# Patient Record
Sex: Female | Born: 1960 | Race: White | Hispanic: No | Marital: Married | Smoking: Former smoker
Health system: Southern US, Community
[De-identification: ages and names within clinical notes are randomized; demographics above are authoritative.]

## PROBLEM LIST (undated history)

## (undated) DIAGNOSIS — A6009 Herpesviral infection of other urogenital tract: Secondary | ICD-10-CM

## (undated) DIAGNOSIS — Z9010 Acquired absence of unspecified breast and nipple: Secondary | ICD-10-CM

## (undated) DIAGNOSIS — F32A Depression, unspecified: Secondary | ICD-10-CM

## (undated) DIAGNOSIS — I10 Essential (primary) hypertension: Secondary | ICD-10-CM

## (undated) DIAGNOSIS — F329 Major depressive disorder, single episode, unspecified: Secondary | ICD-10-CM

## (undated) DIAGNOSIS — E785 Hyperlipidemia, unspecified: Secondary | ICD-10-CM

## (undated) DIAGNOSIS — C50919 Malignant neoplasm of unspecified site of unspecified female breast: Secondary | ICD-10-CM

## (undated) HISTORY — DX: Essential (primary) hypertension: I10

## (undated) HISTORY — PX: OOPHORECTOMY: SHX86

## (undated) HISTORY — DX: Depression, unspecified: F32.A

## (undated) HISTORY — DX: Malignant neoplasm of unspecified site of unspecified female breast: C50.919

## (undated) HISTORY — DX: Acquired absence of unspecified breast and nipple: Z90.10

## (undated) HISTORY — PX: SALPINGOOPHORECTOMY: SHX82

## (undated) HISTORY — PX: BREAST BIOPSY: SHX20

## (undated) HISTORY — DX: Major depressive disorder, single episode, unspecified: F32.9

## (undated) HISTORY — PX: ABDOMINAL HYSTERECTOMY: SHX81

## (undated) HISTORY — DX: Herpesviral infection of other urogenital tract: A60.09

## (undated) HISTORY — DX: Hyperlipidemia, unspecified: E78.5

## (undated) HISTORY — PX: BREAST RECONSTRUCTION: SHX9

## (undated) HISTORY — PX: REDUCTION MAMMAPLASTY: SUR839

## (undated) HISTORY — PX: BREAST SURGERY: SHX581

## (undated) HISTORY — PX: CHOLECYSTECTOMY: SHX55

---

## 2002-10-21 ENCOUNTER — Other Ambulatory Visit: Admission: RE | Admit: 2002-10-21 | Discharge: 2002-10-21 | Payer: Self-pay | Admitting: *Deleted

## 2004-04-01 DIAGNOSIS — C50911 Malignant neoplasm of unspecified site of right female breast: Secondary | ICD-10-CM

## 2004-04-01 HISTORY — PX: MASTECTOMY: SHX3

## 2004-04-01 HISTORY — DX: Malignant neoplasm of unspecified site of right female breast: C50.911

## 2004-10-31 ENCOUNTER — Ambulatory Visit: Payer: Self-pay | Admitting: Endocrinology

## 2004-11-06 ENCOUNTER — Encounter (INDEPENDENT_AMBULATORY_CARE_PROVIDER_SITE_OTHER): Payer: Self-pay | Admitting: *Deleted

## 2004-11-06 ENCOUNTER — Encounter: Admission: RE | Admit: 2004-11-06 | Discharge: 2004-11-06 | Payer: Self-pay | Admitting: General Surgery

## 2004-11-06 ENCOUNTER — Encounter (INDEPENDENT_AMBULATORY_CARE_PROVIDER_SITE_OTHER): Payer: Self-pay | Admitting: Radiology

## 2004-11-13 ENCOUNTER — Encounter: Admission: RE | Admit: 2004-11-13 | Discharge: 2004-11-13 | Payer: Self-pay | Admitting: General Surgery

## 2004-11-19 ENCOUNTER — Encounter: Admission: RE | Admit: 2004-11-19 | Discharge: 2004-11-19 | Payer: Self-pay | Admitting: General Surgery

## 2004-11-21 ENCOUNTER — Ambulatory Visit (HOSPITAL_BASED_OUTPATIENT_CLINIC_OR_DEPARTMENT_OTHER): Admission: RE | Admit: 2004-11-21 | Discharge: 2004-11-22 | Payer: Self-pay | Admitting: General Surgery

## 2004-11-21 ENCOUNTER — Ambulatory Visit (HOSPITAL_COMMUNITY): Admission: RE | Admit: 2004-11-21 | Discharge: 2004-11-21 | Payer: Self-pay | Admitting: General Surgery

## 2004-11-21 ENCOUNTER — Encounter (INDEPENDENT_AMBULATORY_CARE_PROVIDER_SITE_OTHER): Payer: Self-pay | Admitting: *Deleted

## 2004-11-21 DIAGNOSIS — Z9010 Acquired absence of unspecified breast and nipple: Secondary | ICD-10-CM

## 2004-11-21 HISTORY — DX: Acquired absence of unspecified breast and nipple: Z90.10

## 2004-11-21 HISTORY — PX: MODIFIED RADICAL MASTECTOMY W/ AXILLARY LYMPH NODE DISSECTION: SHX2042

## 2004-11-29 ENCOUNTER — Ambulatory Visit: Payer: Self-pay | Admitting: Oncology

## 2004-12-12 ENCOUNTER — Ambulatory Visit (HOSPITAL_BASED_OUTPATIENT_CLINIC_OR_DEPARTMENT_OTHER): Admission: RE | Admit: 2004-12-12 | Discharge: 2004-12-12 | Payer: Self-pay

## 2004-12-12 ENCOUNTER — Ambulatory Visit (HOSPITAL_COMMUNITY): Admission: RE | Admit: 2004-12-12 | Discharge: 2004-12-12 | Payer: Self-pay

## 2004-12-13 ENCOUNTER — Ambulatory Visit: Payer: Self-pay | Admitting: Radiation Oncology

## 2005-01-14 ENCOUNTER — Ambulatory Visit: Payer: Self-pay | Admitting: Oncology

## 2005-03-04 ENCOUNTER — Ambulatory Visit: Payer: Self-pay | Admitting: Oncology

## 2005-04-19 ENCOUNTER — Ambulatory Visit: Payer: Self-pay | Admitting: Oncology

## 2005-04-22 ENCOUNTER — Ambulatory Visit: Payer: Self-pay | Admitting: Radiation Oncology

## 2005-05-02 ENCOUNTER — Ambulatory Visit: Payer: Self-pay | Admitting: Radiation Oncology

## 2005-05-30 ENCOUNTER — Ambulatory Visit: Payer: Self-pay | Admitting: Radiation Oncology

## 2005-06-14 ENCOUNTER — Ambulatory Visit: Payer: Self-pay | Admitting: Oncology

## 2005-06-30 ENCOUNTER — Ambulatory Visit: Payer: Self-pay | Admitting: Radiation Oncology

## 2005-08-23 ENCOUNTER — Ambulatory Visit: Payer: Self-pay | Admitting: Oncology

## 2005-08-27 LAB — CBC WITH DIFFERENTIAL (CANCER CENTER ONLY)
BASO#: 0.1 10*3/uL (ref 0.0–0.2)
BASO%: 1.6 % (ref 0.0–2.0)
EOS%: 4.5 % (ref 0.0–7.0)
Eosinophils Absolute: 0.2 10*3/uL (ref 0.0–0.5)
HCT: 38.6 % (ref 34.8–46.6)
HGB: 13 g/dL (ref 11.6–15.9)
LYMPH#: 1 10*3/uL (ref 0.9–3.3)
LYMPH%: 25 % (ref 14.0–48.0)
MCH: 32.4 pg (ref 26.0–34.0)
MCHC: 33.8 g/dL (ref 32.0–36.0)
MCV: 96 fL (ref 81–101)
MONO#: 0.3 10*3/uL (ref 0.1–0.9)
MONO%: 7.9 % (ref 0.0–13.0)
NEUT#: 2.5 10*3/uL (ref 1.5–6.5)
NEUT%: 61 % (ref 39.6–80.0)
Platelets: 122 10*3/uL — ABNORMAL LOW (ref 145–400)
RBC: 4.03 10*6/uL (ref 3.70–5.32)
RDW: 11.4 % (ref 10.5–14.6)
WBC: 4.1 10*3/uL (ref 3.9–10.0)

## 2005-08-27 LAB — LACTATE DEHYDROGENASE: LDH: 122 U/L (ref 94–250)

## 2005-08-27 LAB — COMPREHENSIVE METABOLIC PANEL
ALT: 19 U/L (ref 0–40)
AST: 23 U/L (ref 0–37)
Albumin: 3.7 g/dL (ref 3.5–5.2)
Alkaline Phosphatase: 64 U/L (ref 39–117)
BUN: 13 mg/dL (ref 6–23)
CO2: 26 mEq/L (ref 19–32)
Calcium: 9 mg/dL (ref 8.4–10.5)
Chloride: 105 mEq/L (ref 96–112)
Creatinine, Ser: 1.1 mg/dL (ref 0.4–1.2)
Glucose, Bld: 85 mg/dL (ref 70–99)
Potassium: 3.8 mEq/L (ref 3.5–5.3)
Sodium: 141 mEq/L (ref 135–145)
Total Bilirubin: 0.9 mg/dL (ref 0.3–1.2)
Total Protein: 6.3 g/dL (ref 6.0–8.3)

## 2005-08-27 LAB — CANCER ANTIGEN 27.29: CA 27.29: 13 U/mL (ref 0–39)

## 2005-09-09 ENCOUNTER — Ambulatory Visit (HOSPITAL_BASED_OUTPATIENT_CLINIC_OR_DEPARTMENT_OTHER): Admission: RE | Admit: 2005-09-09 | Discharge: 2005-09-09 | Payer: Self-pay | Admitting: General Surgery

## 2005-11-07 ENCOUNTER — Encounter: Admission: RE | Admit: 2005-11-07 | Discharge: 2005-11-07 | Payer: Self-pay | Admitting: Oncology

## 2005-11-11 ENCOUNTER — Ambulatory Visit: Payer: Self-pay | Admitting: Oncology

## 2005-11-12 LAB — CBC WITH DIFFERENTIAL (CANCER CENTER ONLY)
BASO#: 0 10*3/uL (ref 0.0–0.2)
BASO%: 0.3 % (ref 0.0–2.0)
EOS%: 3.9 % (ref 0.0–7.0)
Eosinophils Absolute: 0.1 10*3/uL (ref 0.0–0.5)
HCT: 40 % (ref 34.8–46.6)
HGB: 13.4 g/dL (ref 11.6–15.9)
LYMPH#: 0.9 10*3/uL (ref 0.9–3.3)
LYMPH%: 25.9 % (ref 14.0–48.0)
MCH: 31.5 pg (ref 26.0–34.0)
MCHC: 33.4 g/dL (ref 32.0–36.0)
MCV: 94 fL (ref 81–101)
MONO#: 0.2 10*3/uL (ref 0.1–0.9)
MONO%: 5.5 % (ref 0.0–13.0)
NEUT#: 2.2 10*3/uL (ref 1.5–6.5)
NEUT%: 64.4 % (ref 39.6–80.0)
Platelets: 139 10*3/uL — ABNORMAL LOW (ref 145–400)
RBC: 4.25 10*6/uL (ref 3.70–5.32)
RDW: 10.2 % — ABNORMAL LOW (ref 10.5–14.6)
WBC: 3.4 10*3/uL — ABNORMAL LOW (ref 3.9–10.0)

## 2005-11-12 LAB — CANCER ANTIGEN 27.29: CA 27.29: 16 U/mL (ref 0–39)

## 2005-11-12 LAB — COMPREHENSIVE METABOLIC PANEL
ALT: 15 U/L (ref 0–40)
AST: 19 U/L (ref 0–37)
Albumin: 4.3 g/dL (ref 3.5–5.2)
Alkaline Phosphatase: 68 U/L (ref 39–117)
BUN: 17 mg/dL (ref 6–23)
CO2: 26 mEq/L (ref 19–32)
Calcium: 9 mg/dL (ref 8.4–10.5)
Chloride: 105 mEq/L (ref 96–112)
Creatinine, Ser: 0.98 mg/dL (ref 0.40–1.20)
Glucose, Bld: 85 mg/dL (ref 70–99)
Potassium: 4.1 mEq/L (ref 3.5–5.3)
Sodium: 142 mEq/L (ref 135–145)
Total Bilirubin: 0.6 mg/dL (ref 0.3–1.2)
Total Protein: 6.9 g/dL (ref 6.0–8.3)

## 2005-11-12 LAB — LACTATE DEHYDROGENASE: LDH: 145 U/L (ref 94–250)

## 2005-11-14 ENCOUNTER — Ambulatory Visit (HOSPITAL_COMMUNITY): Admission: RE | Admit: 2005-11-14 | Discharge: 2005-11-14 | Payer: Self-pay | Admitting: Oncology

## 2005-12-19 ENCOUNTER — Ambulatory Visit: Payer: Self-pay | Admitting: Radiation Oncology

## 2006-02-13 ENCOUNTER — Encounter: Admission: RE | Admit: 2006-02-13 | Discharge: 2006-02-13 | Payer: Self-pay | Admitting: General Surgery

## 2006-05-20 ENCOUNTER — Ambulatory Visit: Payer: Self-pay | Admitting: Oncology

## 2006-05-21 LAB — CBC WITH DIFFERENTIAL (CANCER CENTER ONLY)
BASO#: 0 10*3/uL (ref 0.0–0.2)
BASO%: 0.5 % (ref 0.0–2.0)
EOS%: 2 % (ref 0.0–7.0)
Eosinophils Absolute: 0.1 10*3/uL (ref 0.0–0.5)
HCT: 41 % (ref 34.8–46.6)
HGB: 14 g/dL (ref 11.6–15.9)
LYMPH#: 1.2 10*3/uL (ref 0.9–3.3)
LYMPH%: 27.2 % (ref 14.0–48.0)
MCH: 31.7 pg (ref 26.0–34.0)
MCHC: 34.1 g/dL (ref 32.0–36.0)
MCV: 93 fL (ref 81–101)
MONO#: 0.3 10*3/uL (ref 0.1–0.9)
MONO%: 6.7 % (ref 0.0–13.0)
NEUT#: 2.8 10*3/uL (ref 1.5–6.5)
NEUT%: 63.6 % (ref 39.6–80.0)
Platelets: 158 10*3/uL (ref 145–400)
RBC: 4.42 10*6/uL (ref 3.70–5.32)
RDW: 11.1 % (ref 10.5–14.6)
WBC: 4.4 10*3/uL (ref 3.9–10.0)

## 2006-05-21 LAB — COMPREHENSIVE METABOLIC PANEL
ALT: 18 U/L (ref 0–35)
AST: 19 U/L (ref 0–37)
Albumin: 4.2 g/dL (ref 3.5–5.2)
Alkaline Phosphatase: 69 U/L (ref 39–117)
BUN: 11 mg/dL (ref 6–23)
CO2: 26 mEq/L (ref 19–32)
Calcium: 9.3 mg/dL (ref 8.4–10.5)
Chloride: 104 mEq/L (ref 96–112)
Creatinine, Ser: 0.99 mg/dL (ref 0.40–1.20)
Glucose, Bld: 84 mg/dL (ref 70–99)
Potassium: 3.8 mEq/L (ref 3.5–5.3)
Sodium: 142 mEq/L (ref 135–145)
Total Bilirubin: 0.6 mg/dL (ref 0.3–1.2)
Total Protein: 7.2 g/dL (ref 6.0–8.3)

## 2006-05-21 LAB — FSH/LH
FSH: 18.5 m[IU]/mL
LH: 19.7 m[IU]/mL

## 2006-05-21 LAB — CANCER ANTIGEN 27.29: CA 27.29: 15 U/mL (ref 0–39)

## 2006-06-02 LAB — ESTRADIOL, ULTRA SENS: Estradiol, Ultra Sensitive: <2 pg/mL

## 2006-06-18 ENCOUNTER — Ambulatory Visit: Payer: Self-pay | Admitting: Radiation Oncology

## 2006-11-18 ENCOUNTER — Ambulatory Visit: Payer: Self-pay | Admitting: Oncology

## 2006-11-19 LAB — COMPREHENSIVE METABOLIC PANEL
ALT: 15 U/L (ref 0–35)
AST: 20 U/L (ref 0–37)
Albumin: 4.2 g/dL (ref 3.5–5.2)
Alkaline Phosphatase: 62 U/L (ref 39–117)
BUN: 13 mg/dL (ref 6–23)
CO2: 26 mEq/L (ref 19–32)
Calcium: 9.1 mg/dL (ref 8.4–10.5)
Chloride: 102 mEq/L (ref 96–112)
Creatinine, Ser: 0.88 mg/dL (ref 0.40–1.20)
Glucose, Bld: 86 mg/dL (ref 70–99)
Potassium: 4.1 mEq/L (ref 3.5–5.3)
Sodium: 137 mEq/L (ref 135–145)
Total Bilirubin: 0.7 mg/dL (ref 0.3–1.2)
Total Protein: 6.9 g/dL (ref 6.0–8.3)

## 2006-11-19 LAB — CBC WITH DIFFERENTIAL (CANCER CENTER ONLY)
BASO#: 0 10*3/uL (ref 0.0–0.2)
BASO%: 0.5 % (ref 0.0–2.0)
EOS%: 1.8 % (ref 0.0–7.0)
Eosinophils Absolute: 0.1 10*3/uL (ref 0.0–0.5)
HCT: 39.6 % (ref 34.8–46.6)
HGB: 13.5 g/dL (ref 11.6–15.9)
LYMPH#: 1.4 10*3/uL (ref 0.9–3.3)
LYMPH%: 29 % (ref 14.0–48.0)
MCH: 31 pg (ref 26.0–34.0)
MCHC: 34.2 g/dL (ref 32.0–36.0)
MCV: 91 fL (ref 81–101)
MONO#: 0.2 10*3/uL (ref 0.1–0.9)
MONO%: 4.3 % (ref 0.0–13.0)
NEUT#: 3.2 10*3/uL (ref 1.5–6.5)
NEUT%: 64.4 % (ref 39.6–80.0)
Platelets: 146 10*3/uL (ref 145–400)
RBC: 4.36 10*6/uL (ref 3.70–5.32)
RDW: 11.5 % (ref 10.5–14.6)
WBC: 4.9 10*3/uL (ref 3.9–10.0)

## 2006-11-19 LAB — LACTATE DEHYDROGENASE: LDH: 150 U/L (ref 94–250)

## 2006-11-19 LAB — CANCER ANTIGEN 27.29: CA 27.29: 13 U/mL (ref 0–39)

## 2006-11-20 ENCOUNTER — Encounter: Admission: RE | Admit: 2006-11-20 | Discharge: 2006-11-20 | Payer: Self-pay | Admitting: Oncology

## 2006-11-25 ENCOUNTER — Ambulatory Visit (HOSPITAL_COMMUNITY): Admission: RE | Admit: 2006-11-25 | Discharge: 2006-11-25 | Payer: Self-pay | Admitting: Oncology

## 2006-12-04 ENCOUNTER — Encounter: Admission: RE | Admit: 2006-12-04 | Discharge: 2006-12-04 | Payer: Self-pay | Admitting: Oncology

## 2007-05-18 ENCOUNTER — Ambulatory Visit: Payer: Self-pay | Admitting: Oncology

## 2007-05-20 LAB — CBC WITH DIFFERENTIAL (CANCER CENTER ONLY)
BASO#: 0 10*3/uL (ref 0.0–0.2)
BASO%: 0.4 % (ref 0.0–2.0)
EOS%: 2.6 % (ref 0.0–7.0)
Eosinophils Absolute: 0.1 10*3/uL (ref 0.0–0.5)
HCT: 38.1 % (ref 34.8–46.6)
HGB: 13.1 g/dL (ref 11.6–15.9)
LYMPH#: 1.6 10*3/uL (ref 0.9–3.3)
LYMPH%: 34 % (ref 14.0–48.0)
MCH: 33 pg (ref 26.0–34.0)
MCHC: 34.4 g/dL (ref 32.0–36.0)
MCV: 96 fL (ref 81–101)
MONO#: 0.3 10*3/uL (ref 0.1–0.9)
MONO%: 5.9 % (ref 0.0–13.0)
NEUT#: 2.7 10*3/uL (ref 1.5–6.5)
NEUT%: 57.1 % (ref 39.6–80.0)
Platelets: 172 10*3/uL (ref 145–400)
RBC: 3.98 10*6/uL (ref 3.70–5.32)
RDW: 10.9 % (ref 10.5–14.6)
WBC: 4.7 10*3/uL (ref 3.9–10.0)

## 2007-05-21 LAB — COMPREHENSIVE METABOLIC PANEL
ALT: 19 U/L (ref 0–35)
AST: 19 U/L (ref 0–37)
Albumin: 4.3 g/dL (ref 3.5–5.2)
Alkaline Phosphatase: 62 U/L (ref 39–117)
BUN: 13 mg/dL (ref 6–23)
CO2: 27 mEq/L (ref 19–32)
Calcium: 9.4 mg/dL (ref 8.4–10.5)
Chloride: 101 mEq/L (ref 96–112)
Creatinine, Ser: 1.03 mg/dL (ref 0.40–1.20)
Glucose, Bld: 94 mg/dL (ref 70–99)
Potassium: 3.6 mEq/L (ref 3.5–5.3)
Sodium: 139 mEq/L (ref 135–145)
Total Bilirubin: 0.5 mg/dL (ref 0.3–1.2)
Total Protein: 7.1 g/dL (ref 6.0–8.3)

## 2007-05-21 LAB — CANCER ANTIGEN 27.29: CA 27.29: 8 U/mL (ref 0–39)

## 2007-11-23 ENCOUNTER — Encounter: Admission: RE | Admit: 2007-11-23 | Discharge: 2007-11-23 | Payer: Self-pay | Admitting: Oncology

## 2007-11-24 ENCOUNTER — Ambulatory Visit: Payer: Self-pay | Admitting: Obstetrics & Gynecology

## 2007-11-25 ENCOUNTER — Ambulatory Visit: Payer: Self-pay | Admitting: Oncology

## 2007-11-26 LAB — CBC WITH DIFFERENTIAL (CANCER CENTER ONLY)
BASO#: 0 10e3/uL (ref 0.0–0.2)
BASO%: 0.4 % (ref 0.0–2.0)
EOS%: 2.8 % (ref 0.0–7.0)
Eosinophils Absolute: 0.1 10e3/uL (ref 0.0–0.5)
HCT: 36 % (ref 34.8–46.6)
HGB: 12.7 g/dL (ref 11.6–15.9)
LYMPH#: 1.8 10e3/uL (ref 0.9–3.3)
LYMPH%: 40.5 % (ref 14.0–48.0)
MCH: 33.3 pg (ref 26.0–34.0)
MCHC: 35.3 g/dL (ref 32.0–36.0)
MCV: 94 fL (ref 81–101)
MONO#: 0.3 10e3/uL (ref 0.1–0.9)
MONO%: 6.6 % (ref 0.0–13.0)
NEUT#: 2.2 10e3/uL (ref 1.5–6.5)
NEUT%: 49.7 % (ref 39.6–80.0)
Platelets: 156 10e3/uL (ref 145–400)
RBC: 3.82 10e6/uL (ref 3.70–5.32)
RDW: 11.4 % (ref 10.5–14.6)
WBC: 4.3 10e3/uL (ref 3.9–10.0)

## 2007-11-26 LAB — COMPREHENSIVE METABOLIC PANEL WITH GFR
ALT: 18 U/L (ref 0–35)
AST: 23 U/L (ref 0–37)
Albumin: 4.2 g/dL (ref 3.5–5.2)
Alkaline Phosphatase: 48 U/L (ref 39–117)
BUN: 22 mg/dL (ref 6–23)
CO2: 23 meq/L (ref 19–32)
Calcium: 9.2 mg/dL (ref 8.4–10.5)
Chloride: 102 meq/L (ref 96–112)
Creatinine, Ser: 1.18 mg/dL (ref 0.40–1.20)
Glucose, Bld: 86 mg/dL (ref 70–99)
Potassium: 3.8 meq/L (ref 3.5–5.3)
Sodium: 137 meq/L (ref 135–145)
Total Bilirubin: 0.8 mg/dL (ref 0.3–1.2)
Total Protein: 7 g/dL (ref 6.0–8.3)

## 2007-11-26 LAB — CANCER ANTIGEN 27.29: CA 27.29: 13 U/mL (ref 0–39)

## 2007-12-09 ENCOUNTER — Ambulatory Visit: Payer: Self-pay | Admitting: Obstetrics and Gynecology

## 2008-02-19 IMAGING — PT NM PET TUM IMG SKULL BASE T - THIGH
6 series · 25 of 25 positions shown · non-contrast
Comparison: PET-CT, 11/14/2005

CLINICAL DATA: 46-year-old female history of right-sided breast cancer. 
Status-post chemotherapy last treatment April 2005 with Tamoxifen ongoing. 
Radiation therapy last May 2005.  Single right axillary sentinel lymph node
positive for metastatic carcinoma.
Restaging:
FDG PET-CT TUMOR IMAGING (SKULL BASE TO THIGHS):

Fasting Blood Glucose:  88
TECHNIQUE: 17.2 mCi F-18 FDG was injected intravenously via the left wrist . 
Full-ring PET imaging was performed from the skull base through the mid-thighs
65 minutes after injection.  CT data was obtained and used for attenuation
correction and anatomic localization only.  (This was not acquired as a
diagnostic CT examination.)

[Series 1: pet ac · axial · 3.3mm · 4.69mm/px · z∈[-870,+0]mm · 5 of 267 slices shown]
[im 1/267]
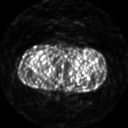
[im 67/267]
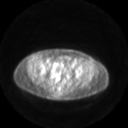
[im 134/267]
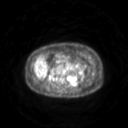
[im 200/267]
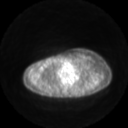
[im 267/267]
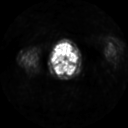

[Series 2: pet nac · axial · 3.3mm · 4.69mm/px · z∈[-870,+0]mm · 6 of 267 slices shown]
[im 1/267]
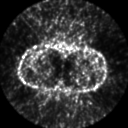
[im 54/267]
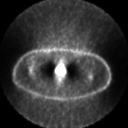
[im 107/267]
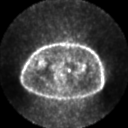
[im 160/267]
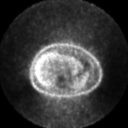
[im 213/267]
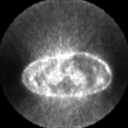
[im 267/267]
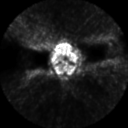

[Series 2: ct images · axial · 3.8mm · 0.98mm/px · z∈[-870,+0]mm · 6 of 267 slices shown]
[im 1/267]
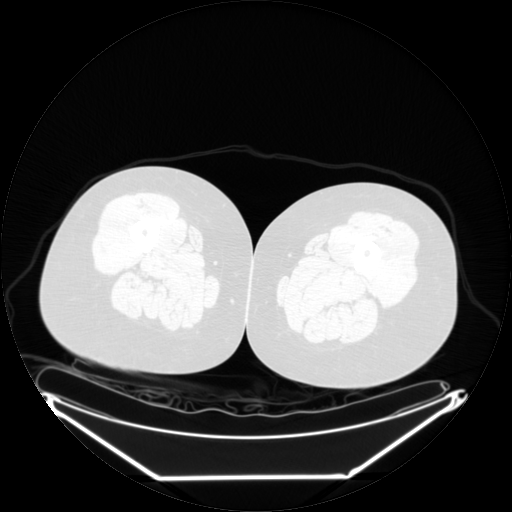
[im 54/267]
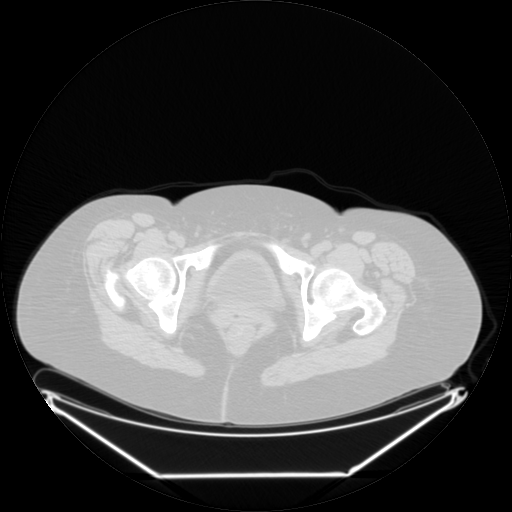
[im 107/267]
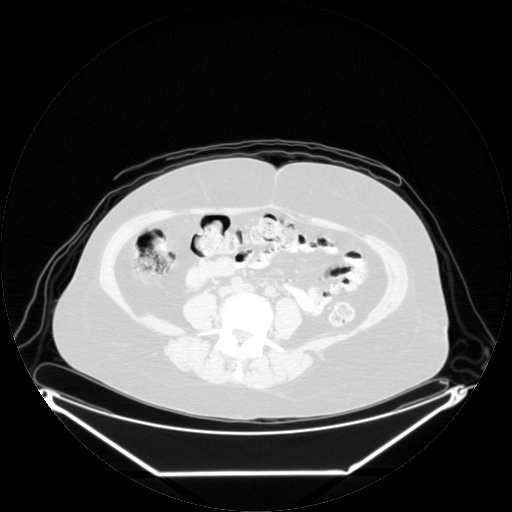
[im 160/267]
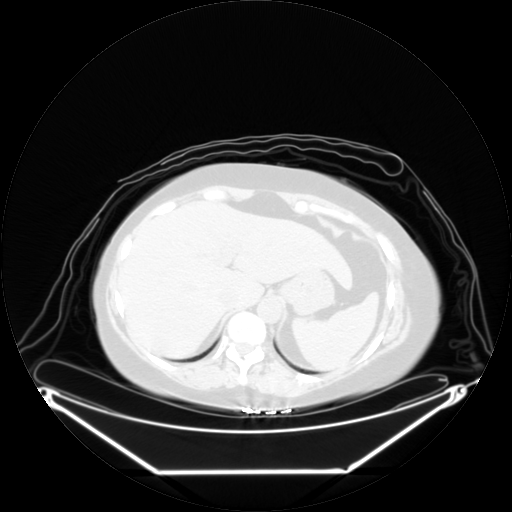
[im 213/267]
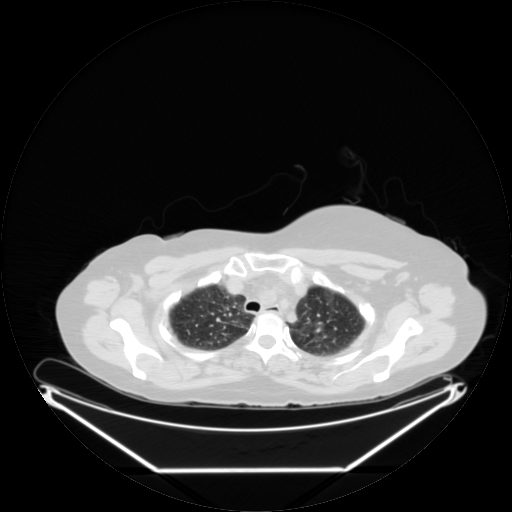
[im 267/267  brain]
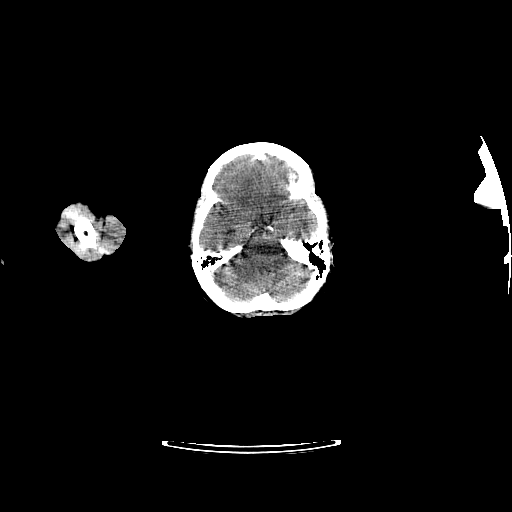

[Series 123: mip · coronal · 3.3mm · 4.69mm/px · 1 of 30 slices shown]
[im 1/30]
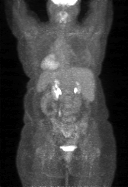

[Series 151: reformatted · axial · 3.3mm · 3.91mm/px · z∈[-867,-10]mm · 6 of 261 slices shown (1 of 2)]
[im 1/261]
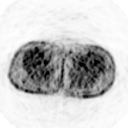
[im 53/261]
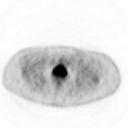
[im 105/261]
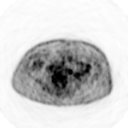
[im 157/261]
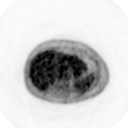
[im 209/261]
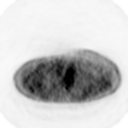
[im 261/261]
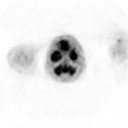

[Series 153: reformatted · coronal · 4.7mm · 6.98mm/px · 1 of 57 slices shown (2 of 2)]
[im 1/57]
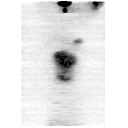

[25 of 25 positions shown; findings below may reference images not displayed]

FINDINGS: Neck: No evidence of hypermetabolic or lymphadenopathy.

Chest: This prior right mastectomy and axillary nodal dissection. Mild along the
postsurgical site extending to the axillary nodal dissection is slightly more
hypermetabolic than prior measuring SUV max 2.6 compared to 1.7 on prior. There
is also mild thickening of the tissue medially adjacent to the axilla surgical
clips which now measures 7 mm compared to 4 mm on  prior. No evidence of
hypermetabolic right  axillary nodes. There is a hypermetabolic left axillary
lymph node which is has benign morphology measuring 7 mm with SUV max = 2.3. No
evidence of mediastinal adenopathy or hypermetabolic nodes. No evidence
hypermetabolic or suspicious pulmonary nodules.

Abdomen: No abnormal FDG activity in the liver, spleen, pancreas, adrenal
glands, kidneys. Stable hypodensity within the right lateral of the liver.

Pelvis: No evidence of hypermetabolic nodes or lymphadenopathy in the pelvis.13
mm left inguinal node is  unchanged from prior and does not have significant
metabolic activity.
IMPRESSION: 1. Mild thickening in the region of the right axillary nodal dissection along
with diffuse mild  increased metabolic activity within the mastectomy surgical
bed are nonspecific findings and likely related to postsurgical change and/or
radiation change. Typically however active does not increase over time.
Recommend attention on followup.

2. Low-level uptake of a left axillary lymph node with unchanged CT imaging
characteristics  may be related to same side FDG venous injection. Nonspecific
finding.  Recommend attention on follow up.

3. No evidence of metastatic disease in the abdomen or pelvis.

## 2008-04-12 ENCOUNTER — Ambulatory Visit (HOSPITAL_COMMUNITY): Admission: RE | Admit: 2008-04-12 | Discharge: 2008-04-12 | Payer: Self-pay | Admitting: Obstetrics and Gynecology

## 2008-04-12 ENCOUNTER — Encounter (INDEPENDENT_AMBULATORY_CARE_PROVIDER_SITE_OTHER): Payer: Self-pay | Admitting: Obstetrics and Gynecology

## 2008-04-26 ENCOUNTER — Ambulatory Visit: Payer: Self-pay | Admitting: Oncology

## 2008-06-09 ENCOUNTER — Ambulatory Visit: Payer: Self-pay | Admitting: Oncology

## 2008-06-15 LAB — CMP (CANCER CENTER ONLY)
ALT(SGPT): 25 U/L (ref 10–47)
AST: 32 U/L (ref 11–38)
Albumin: 3.8 g/dL (ref 3.3–5.5)
Alkaline Phosphatase: 64 U/L (ref 26–84)
BUN, Bld: 14 mg/dL (ref 7–22)
CO2: 28 mEq/L (ref 18–33)
Calcium: 9.4 mg/dL (ref 8.0–10.3)
Chloride: 98 mEq/L (ref 98–108)
Creat: 1 mg/dl (ref 0.6–1.2)
Glucose, Bld: 91 mg/dL (ref 73–118)
Potassium: 3.7 mEq/L (ref 3.3–4.7)
Sodium: 140 mEq/L (ref 128–145)
Total Bilirubin: 1 mg/dl (ref 0.20–1.60)
Total Protein: 7.6 g/dL (ref 6.4–8.1)

## 2008-06-15 LAB — CBC WITH DIFFERENTIAL (CANCER CENTER ONLY)
BASO#: 0 10*3/uL (ref 0.0–0.2)
BASO%: 0.7 % (ref 0.0–2.0)
EOS%: 3.4 % (ref 0.0–7.0)
Eosinophils Absolute: 0.2 10*3/uL (ref 0.0–0.5)
HCT: 41.3 % (ref 34.8–46.6)
HGB: 14 g/dL (ref 11.6–15.9)
LYMPH#: 1.4 10*3/uL (ref 0.9–3.3)
LYMPH%: 33.8 % (ref 14.0–48.0)
MCH: 31.2 pg (ref 26.0–34.0)
MCHC: 33.9 g/dL (ref 32.0–36.0)
MCV: 92 fL (ref 81–101)
MONO#: 0.2 10*3/uL (ref 0.1–0.9)
MONO%: 5.6 % (ref 0.0–13.0)
NEUT#: 2.4 10*3/uL (ref 1.5–6.5)
NEUT%: 56.5 % (ref 39.6–80.0)
Platelets: 172 10*3/uL (ref 145–400)
RBC: 4.48 10*6/uL (ref 3.70–5.32)
RDW: 10.9 % (ref 10.5–14.6)
WBC: 4.3 10*3/uL (ref 3.9–10.0)

## 2008-06-15 LAB — LACTATE DEHYDROGENASE: LDH: 172 U/L (ref 94–250)

## 2008-09-13 ENCOUNTER — Ambulatory Visit: Payer: Self-pay | Admitting: Oncology

## 2008-09-15 LAB — CMP (CANCER CENTER ONLY)
ALT(SGPT): 30 U/L (ref 10–47)
AST: 34 U/L (ref 11–38)
Albumin: 3.8 g/dL (ref 3.3–5.5)
Alkaline Phosphatase: 79 U/L (ref 26–84)
BUN, Bld: 14 mg/dL (ref 7–22)
CO2: 30 mEq/L (ref 18–33)
Calcium: 9.6 mg/dL (ref 8.0–10.3)
Chloride: 103 mEq/L (ref 98–108)
Creat: 1.1 mg/dl (ref 0.6–1.2)
Glucose, Bld: 88 mg/dL (ref 73–118)
Potassium: 3.8 mEq/L (ref 3.3–4.7)
Sodium: 143 mEq/L (ref 128–145)
Total Bilirubin: 1 mg/dl (ref 0.20–1.60)
Total Protein: 7.6 g/dL (ref 6.4–8.1)

## 2008-09-15 LAB — CBC WITH DIFFERENTIAL (CANCER CENTER ONLY)
BASO#: 0 10*3/uL (ref 0.0–0.2)
BASO%: 0.6 % (ref 0.0–2.0)
EOS%: 3.3 % (ref 0.0–7.0)
Eosinophils Absolute: 0.2 10*3/uL (ref 0.0–0.5)
HCT: 37.9 % (ref 34.8–46.6)
HGB: 13.4 g/dL (ref 11.6–15.9)
LYMPH#: 1.6 10*3/uL (ref 0.9–3.3)
LYMPH%: 32.4 % (ref 14.0–48.0)
MCH: 31.6 pg (ref 26.0–34.0)
MCHC: 35.4 g/dL (ref 32.0–36.0)
MCV: 89 fL (ref 81–101)
MONO#: 0.3 10*3/uL (ref 0.1–0.9)
MONO%: 6.1 % (ref 0.0–13.0)
NEUT#: 2.8 10*3/uL (ref 1.5–6.5)
NEUT%: 57.6 % (ref 39.6–80.0)
Platelets: 192 10*3/uL (ref 145–400)
RBC: 4.26 10*6/uL (ref 3.70–5.32)
RDW: 12 % (ref 10.5–14.6)
WBC: 4.9 10*3/uL (ref 3.9–10.0)

## 2008-09-15 LAB — CANCER ANTIGEN 27.29: CA 27.29: 8 U/mL (ref 0–39)

## 2008-11-23 ENCOUNTER — Encounter: Admission: RE | Admit: 2008-11-23 | Discharge: 2008-11-23 | Payer: Self-pay | Admitting: Oncology

## 2009-03-01 ENCOUNTER — Inpatient Hospital Stay (HOSPITAL_COMMUNITY): Admission: RE | Admit: 2009-03-01 | Discharge: 2009-03-04 | Payer: Self-pay | Admitting: Plastic Surgery

## 2009-03-01 ENCOUNTER — Encounter (INDEPENDENT_AMBULATORY_CARE_PROVIDER_SITE_OTHER): Payer: Self-pay | Admitting: Plastic Surgery

## 2009-04-10 ENCOUNTER — Ambulatory Visit: Payer: Self-pay | Admitting: Oncology

## 2009-05-03 LAB — CBC WITH DIFFERENTIAL (CANCER CENTER ONLY)
BASO#: 0 10*3/uL (ref 0.0–0.2)
BASO%: 0.5 % (ref 0.0–2.0)
EOS%: 2.7 % (ref 0.0–7.0)
Eosinophils Absolute: 0.1 10*3/uL (ref 0.0–0.5)
HCT: 38.6 % (ref 34.8–46.6)
HGB: 13.1 g/dL (ref 11.6–15.9)
LYMPH#: 1.4 10*3/uL (ref 0.9–3.3)
LYMPH%: 30.9 % (ref 14.0–48.0)
MCH: 30.8 pg (ref 26.0–34.0)
MCHC: 34 g/dL (ref 32.0–36.0)
MCV: 91 fL (ref 81–101)
MONO#: 0.3 10*3/uL (ref 0.1–0.9)
MONO%: 6.4 % (ref 0.0–13.0)
NEUT#: 2.7 10*3/uL (ref 1.5–6.5)
NEUT%: 59.5 % (ref 39.6–80.0)
Platelets: 169 10*3/uL (ref 145–400)
RBC: 4.26 10*6/uL (ref 3.70–5.32)
RDW: 12.2 % (ref 10.5–14.6)
WBC: 4.5 10*3/uL (ref 3.9–10.0)

## 2009-05-03 LAB — CMP (CANCER CENTER ONLY)
ALT(SGPT): 22 U/L (ref 10–47)
AST: 27 U/L (ref 11–38)
Albumin: 3.8 g/dL (ref 3.3–5.5)
Alkaline Phosphatase: 71 U/L (ref 26–84)
BUN, Bld: 13 mg/dL (ref 7–22)
CO2: 31 mEq/L (ref 18–33)
Calcium: 9.3 mg/dL (ref 8.0–10.3)
Chloride: 98 mEq/L (ref 98–108)
Creat: 0.8 mg/dl (ref 0.6–1.2)
Glucose, Bld: 86 mg/dL (ref 73–118)
Potassium: 4 mEq/L (ref 3.3–4.7)
Sodium: 140 mEq/L (ref 128–145)
Total Bilirubin: 1 mg/dl (ref 0.20–1.60)
Total Protein: 7.3 g/dL (ref 6.4–8.1)

## 2009-05-03 LAB — CANCER ANTIGEN 27.29: CA 27.29: 13 U/mL (ref 0–39)

## 2009-11-10 ENCOUNTER — Ambulatory Visit (HOSPITAL_BASED_OUTPATIENT_CLINIC_OR_DEPARTMENT_OTHER): Payer: PRIVATE HEALTH INSURANCE | Admitting: Oncology

## 2009-11-16 LAB — CBC WITH DIFFERENTIAL (CANCER CENTER ONLY)
BASO#: 0 10*3/uL (ref 0.0–0.2)
BASO%: 0.3 % (ref 0.0–2.0)
EOS%: 3.4 % (ref 0.0–7.0)
Eosinophils Absolute: 0.1 10*3/uL (ref 0.0–0.5)
HCT: 38.8 % (ref 34.8–46.6)
HGB: 13.6 g/dL (ref 11.6–15.9)
LYMPH#: 1.2 10*3/uL (ref 0.9–3.3)
LYMPH%: 33.1 % (ref 14.0–48.0)
MCH: 31.1 pg (ref 26.0–34.0)
MCHC: 35.1 g/dL (ref 32.0–36.0)
MCV: 89 fL (ref 81–101)
MONO#: 0.3 10*3/uL (ref 0.1–0.9)
MONO%: 8.1 % (ref 0.0–13.0)
NEUT#: 2 10*3/uL (ref 1.5–6.5)
NEUT%: 55.1 % (ref 39.6–80.0)
Platelets: 156 10*3/uL (ref 145–400)
RBC: 4.38 10*6/uL (ref 3.70–5.32)
RDW: 12.1 % (ref 10.5–14.6)
WBC: 3.6 10*3/uL — ABNORMAL LOW (ref 3.9–10.0)

## 2009-11-16 LAB — CMP (CANCER CENTER ONLY)
ALT(SGPT): 20 U/L (ref 10–47)
AST: 25 U/L (ref 11–38)
Albumin: 3.7 g/dL (ref 3.3–5.5)
Alkaline Phosphatase: 69 U/L (ref 26–84)
BUN, Bld: 14 mg/dL (ref 7–22)
CO2: 29 mEq/L (ref 18–33)
Calcium: 9.1 mg/dL (ref 8.0–10.3)
Chloride: 102 mEq/L (ref 98–108)
Creat: 0.9 mg/dl (ref 0.6–1.2)
Glucose, Bld: 101 mg/dL (ref 73–118)
Potassium: 3.9 mEq/L (ref 3.3–4.7)
Sodium: 144 mEq/L (ref 128–145)
Total Bilirubin: 1 mg/dl (ref 0.20–1.60)
Total Protein: 7.1 g/dL (ref 6.4–8.1)

## 2009-11-27 ENCOUNTER — Encounter: Admission: RE | Admit: 2009-11-27 | Discharge: 2009-11-27 | Payer: Self-pay | Admitting: Oncology

## 2010-04-22 ENCOUNTER — Encounter: Payer: Self-pay | Admitting: General Surgery

## 2010-06-04 ENCOUNTER — Encounter (HOSPITAL_BASED_OUTPATIENT_CLINIC_OR_DEPARTMENT_OTHER): Payer: PRIVATE HEALTH INSURANCE | Admitting: Oncology

## 2010-06-04 DIAGNOSIS — C50919 Malignant neoplasm of unspecified site of unspecified female breast: Secondary | ICD-10-CM

## 2010-06-04 LAB — COMPREHENSIVE METABOLIC PANEL
ALT: 16 U/L (ref 0–35)
AST: 19 U/L (ref 0–37)
Albumin: 4.8 g/dL (ref 3.5–5.2)
Alkaline Phosphatase: 76 U/L (ref 39–117)
BUN: 22 mg/dL (ref 6–23)
CO2: 24 mEq/L (ref 19–32)
Calcium: 9.4 mg/dL (ref 8.4–10.5)
Chloride: 103 mEq/L (ref 96–112)
Creatinine, Ser: 0.97 mg/dL (ref 0.40–1.20)
Glucose, Bld: 83 mg/dL (ref 70–99)
Potassium: 3.5 mEq/L (ref 3.5–5.3)
Sodium: 140 mEq/L (ref 135–145)
Total Bilirubin: 0.9 mg/dL (ref 0.3–1.2)
Total Protein: 7.3 g/dL (ref 6.0–8.3)

## 2010-06-04 LAB — CBC WITH DIFFERENTIAL/PLATELET
BASO%: 0.9 % (ref 0.0–2.0)
Basophils Absolute: 0 10*3/uL (ref 0.0–0.1)
EOS%: 2.6 % (ref 0.0–7.0)
Eosinophils Absolute: 0.1 10*3/uL (ref 0.0–0.5)
HCT: 42.1 % (ref 34.8–46.6)
HGB: 14.4 g/dL (ref 11.6–15.9)
LYMPH%: 29 % (ref 14.0–49.7)
MCH: 30.8 pg (ref 25.1–34.0)
MCHC: 34.3 g/dL (ref 31.5–36.0)
MCV: 90.1 fL (ref 79.5–101.0)
MONO#: 0.3 10*3/uL (ref 0.1–0.9)
MONO%: 6.6 % (ref 0.0–14.0)
NEUT#: 2.8 10*3/uL (ref 1.5–6.5)
NEUT%: 60.9 % (ref 38.4–76.8)
Platelets: 155 10*3/uL (ref 145–400)
RBC: 4.67 10*6/uL (ref 3.70–5.45)
RDW: 12.8 % (ref 11.2–14.5)
WBC: 4.6 10*3/uL (ref 3.9–10.3)
lymph#: 1.3 10*3/uL (ref 0.9–3.3)

## 2010-07-03 LAB — HEMOGLOBIN AND HEMATOCRIT, BLOOD
HCT: 29.6 % — ABNORMAL LOW (ref 36.0–46.0)
Hemoglobin: 10.3 g/dL — ABNORMAL LOW (ref 12.0–15.0)

## 2010-07-03 LAB — BASIC METABOLIC PANEL
BUN: 13 mg/dL (ref 6–23)
CO2: 26 mEq/L (ref 19–32)
Calcium: 7.5 mg/dL — ABNORMAL LOW (ref 8.4–10.5)
Chloride: 102 mEq/L (ref 96–112)
Creatinine, Ser: 1.11 mg/dL (ref 0.4–1.2)
GFR calc Af Amer: >60 mL/min (ref >60)
GFR calc non Af Amer: 52 mL/min — ABNORMAL LOW (ref >60)
Glucose, Bld: 101 mg/dL — ABNORMAL HIGH (ref 70–99)
Potassium: 3.7 mEq/L (ref 3.5–5.1)
Sodium: 135 mEq/L (ref 135–145)

## 2010-07-04 LAB — CBC
HCT: 42.9 % (ref 36.0–46.0)
Hemoglobin: 14.8 g/dL (ref 12.0–15.0)
MCHC: 34.5 g/dL (ref 30.0–36.0)
MCV: 91.9 fL (ref 78.0–100.0)
Platelets: 139 10*3/uL — ABNORMAL LOW (ref 150–400)
RBC: 4.67 MIL/uL (ref 3.87–5.11)
RDW: 12.9 % (ref 11.5–15.5)
WBC: 4.7 10*3/uL (ref 4.0–10.5)

## 2010-07-04 LAB — BASIC METABOLIC PANEL
BUN: 14 mg/dL (ref 6–23)
CO2: 28 mEq/L (ref 19–32)
Calcium: 9.4 mg/dL (ref 8.4–10.5)
Chloride: 102 mEq/L (ref 96–112)
Creatinine, Ser: 0.99 mg/dL (ref 0.4–1.2)
GFR calc Af Amer: >60 mL/min (ref >60)
GFR calc non Af Amer: 60 mL/min — ABNORMAL LOW (ref >60)
Glucose, Bld: 100 mg/dL — ABNORMAL HIGH (ref 70–99)
Potassium: 3.5 mEq/L (ref 3.5–5.1)
Sodium: 138 mEq/L (ref 135–145)

## 2010-07-16 LAB — CBC
HCT: 41.6 % (ref 36.0–46.0)
Hemoglobin: 14.2 g/dL (ref 12.0–15.0)
MCHC: 34.1 g/dL (ref 30.0–36.0)
MCV: 95.5 fL (ref 78.0–100.0)
Platelets: 152 10*3/uL (ref 150–400)
RBC: 4.36 MIL/uL (ref 3.87–5.11)
RDW: 12.6 % (ref 11.5–15.5)
WBC: 5.6 10*3/uL (ref 4.0–10.5)

## 2010-07-16 LAB — COMPREHENSIVE METABOLIC PANEL
ALT: 26 U/L (ref 0–35)
AST: 27 U/L (ref 0–37)
Albumin: 3.9 g/dL (ref 3.5–5.2)
Alkaline Phosphatase: 59 U/L (ref 39–117)
BUN: 10 mg/dL (ref 6–23)
CO2: 28 mEq/L (ref 19–32)
Calcium: 9 mg/dL (ref 8.4–10.5)
Chloride: 100 mEq/L (ref 96–112)
Creatinine, Ser: 0.84 mg/dL (ref 0.4–1.2)
GFR calc Af Amer: >60 mL/min (ref >60)
GFR calc non Af Amer: >60 mL/min (ref >60)
Glucose, Bld: 95 mg/dL (ref 70–99)
Potassium: 3.7 mEq/L (ref 3.5–5.1)
Sodium: 137 mEq/L (ref 135–145)
Total Bilirubin: 1 mg/dL (ref 0.3–1.2)
Total Protein: 7.4 g/dL (ref 6.0–8.3)

## 2010-07-16 LAB — LACTATE DEHYDROGENASE: LDH: 189 U/L (ref 94–250)

## 2010-07-16 LAB — CANCER ANTIGEN 27.29: CA 27.29: 6 U/mL (ref 0–39)

## 2010-08-14 NOTE — H&P (Signed)
NAMEPOLLYANN, ROA NO.:  [id number]   MEDICAL RECORD NO.:  [id number]          PATIENT TYPE:  AMB   LOCATION:  [facility]                           FACILITY:  [facility]   PHYSICIAN:  Huel Cote, M.D. DATE OF BIRTH:  02-19-1961   DATE OF ADMISSION:  04/12/2008  DATE OF DISCHARGE:                              HISTORY & PHYSICAL   This is Dr. Huel Cote dictating for surgery to take place on  April 12, 2008, at 9 a.m.   Patient is a 50 year old G3, P3 who is coming in for a scheduled  bilateral salpingo-oophorectomy as recommended by her oncologist.  The  patient had breast cancer which was estrogen and progesterone positive  in 2006 and underwent chemo and radiation for this.  A recent FSH  performed within the last 2 months demonstrated that the patient is not  postmenopausal and her oncologist feels that it is in her best interest  to proceed with an oophorectomy to optimize her p.o. chemotherapy  regimen.   PAST MEDICAL HISTORY:  Significant for:  1. The breast cancer in 2006.  2. She also has chronic hypertension and high cholesterol.   PAST SURGICAL HISTORY:  1. The right mastectomy.  2. Cholecystectomy.  3. C-section.  4. Supracervical hysterectomy in 2001 by laparotomy.   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   SHE HAS NO ALLERGIES.   PAST OBSTETRICAL HISTORY:  Significant for 2 vaginal deliveries and 1  cesarean section.   CURRENT MEDICATIONS:  Include:  1. Norvasc.  2. Effexor.  3. Simvastatin.  4. Tamoxifen.  5. Valtrex suppression.   PHYSICAL EXAM:  Her height is 5 feet 7.  Her weight is 195.  Blood  pressure is 120/85.  CARDIAC EXAM:  Regular rate and rhythm.  LUNGS:  Clear.  ABDOMEN:  Soft and nontender.  PELVIC EXAM:  Has normal external genitalia.  No palpable masses.   The patient was given a careful discussion and a history of a possible  abnormal Pap smear was considered and a repeat Pap done  in November  which was normal.  We discussed all options including expected  management and going ahead with removal of both tubes and ovaries.  Patient understands that this will render her menopausal and though  while good for her ultimate protection from estrogen will give her some  significant symptoms in terms of hot flashes and possible other  unforeseen issues.  She did elect to go on Effexor 37.5 for some  symptoms she was experiencing already with depression and tearfulness  and this has helped her significantly in the interim.  The patient  carefully considered all options and given that this would increase her  chances of remaining cancer free decided to proceed with a bilateral  oophorectomy.  The risks and benefits of the surgery were discussed with  the patient in detail including bleeding, infection, and possible damage  to adjacent organs.  She understands the risks of possible abdominal  incision and increased recovery time should any complications arise,  however, desires to  proceed with the surgery as stated.      Huel Cote, M.D.  Electronically Signed     KR/MEDQ  D:  04/11/2008  T:  04/11/2008  Job:  [id number]

## 2010-08-14 NOTE — Op Note (Signed)
NAMESIGOURNEY, PORTILLO                 ACCOUNT NO.:  [id number]   MEDICAL RECORD NO.:  [id number]          PATIENT TYPE:  AMB   LOCATION:  [facility]                           FACILITY:  [facility]   PHYSICIAN:  Huel Cote, M.D. DATE OF BIRTH:  [birth year]   DATE OF PROCEDURE:  04/12/2008  DATE OF DISCHARGE:                               OPERATIVE REPORT   PREOPERATIVE DIAGNOSIS.:  Invasive breast cancer, estrogen-receptor  positive.   POSTOPERATIVE DIAGNOSIS:  Invasive breast cancer, estrogen-receptor  positive.   PROCEDURE:  Bilateral salpingo-oophorectomy.   SURGEON:  Huel Cote, MD   ASSISTANT:  Zenaida Niece, MD   ANESTHESIA:  General.   FINDINGS:  There were adhesions of the bowel to the left side wall in  the cervical stump area and also adhesions in the right upper quadrant  of the omentum.   SPECIMENS:  Bilateral ovaries and tubes were sent to Pathology.   ESTIMATED BLOOD LOSS:  Minimal.   URINE OUTPUT:  200 mL clear urine.   IV FLUIDS:  1200 mL LR.   COMPLICATIONS:  None.   PROCEDURE IN DETAIL:  The patient was taken to the operating room where  general anesthesia was obtained without difficulty.  She was then  prepped and draped in the normal sterile fashion in the dorsal lithotomy  position.  The abdomen was prepped and in-and-out catheter was used to  drain the bladder.  At this point, a small infraumbilical incision was  made through a pre-existing scar with scalpel.  This was elevated and  the Veress needle introduced into the abdomen without difficulty.  Intraperitoneal placement was confirmed with aspiration injection with  normal saline.  The 10/11 trocar was then utilized in this incision site  with the direct OptiVu camera, and introduced without difficulty.  With  the camera in place, the pelvis and abdomen were inspected with the  findings as previously stated.  The right ovary was clearly visible.  The left ovary was embedded under the bowel  adhesions on the left and  could not be seen at this point; therefore, 2 additional trocars 5 mm in  size were introduced into each upper quadrant under direct  visualization.  Each trocar site was injected with 0.25% Marcaine prior  to placement.  With these trocars in place, the bowel adhesions were  taken down with a harmonic scalpel and blunt probe successfully and the  left ovary exposed and seen to be normal.  The ovaries thus exposed.  The adhesions in the right upper quadrant were also taken down with  harmonic scalpel to aid in visualization.  The left ovary was then  grasped and pulled medially away from the lateral side wall.  The  infundibulopelvic ligament was then taken down with the harmonic scalpel  and the base of the ovary taken off the sidewall carefully with the  ovary completely dissected off the pelvic sidewall and free of any  attachment.  It was placed down into the cul-de-sac.  The pelvic  sidewall was carefully inspected and found to be hemostatic.  Therefore,  attention was turned  to the patient's right wherein this ovary was also  grasped and reflected medially.  The infundibulopelvic ligament was  taken down with harmonic scalpel and the ovary taken off the sidewall in  a likewise fashion with harmonic scalpel with careful dissection.  Once  these were both free, they were placed in the cul-de-sac.  The ureters  were inspected bilaterally and seen to be well below the operative field  and peristalsing normally.  The infundibulopelvic ligament was slightly  oozy on the right, so this was additionally cauterized with harmonic  scalpel.  At this point, all appeared hemostatic and the camera was  replaced with a 5-mm camera through the right port.  The Endobag  introduced into the umbilicus and the ovaries placed into the Endobag,  and pulled up to the umbilical incision.  The trocar was removed and the  ovaries pulled out of the umbilical incision without  difficulty.  The  ovaries were then handed off to Pathology.  The pelvis was then again  inspected with the camera and the [place]hat used to irrigate and inspect  all pedicles.  All appeared hemostatic.  The bowel was inspected where  the adhesions were taken down and appeared hemostatic, and no injuries  noted.  At this point, all instruments and trocars were removed from the  abdomen and the pneumoperitoneum reduced.  The umbilical incision was  then closed with one deep suture of 0 Vicryl and a subcuticular stitch  of 3-0 Vicryl with Dermabond on the skin.  The 5-mm trocar sites were  closed with Dermabond.  The sponge, lap, and needle counts were correct  x2, and the patient was awakened without difficulty and taken to the  recovery room.      Huel Cote, M.D.  Electronically Signed     KR/MEDQ  D:  04/12/2008  T:  04/12/2008  Job:  [id number]

## 2010-10-24 ENCOUNTER — Other Ambulatory Visit: Payer: Self-pay | Admitting: Neurological Surgery

## 2010-10-24 DIAGNOSIS — Z1231 Encounter for screening mammogram for malignant neoplasm of breast: Secondary | ICD-10-CM

## 2010-12-04 ENCOUNTER — Other Ambulatory Visit: Payer: Self-pay | Admitting: Family Medicine

## 2010-12-04 ENCOUNTER — Ambulatory Visit
Admission: RE | Admit: 2010-12-04 | Discharge: 2010-12-04 | Disposition: A | Discharge status: Home / Self Care | Payer: No Typology Code available for payment source | Source: Ambulatory Visit | Attending: Neurological Surgery | Admitting: Neurological Surgery

## 2010-12-04 DIAGNOSIS — Z1231 Encounter for screening mammogram for malignant neoplasm of breast: Secondary | ICD-10-CM

## 2011-01-03 ENCOUNTER — Other Ambulatory Visit: Payer: Self-pay | Admitting: Oncology

## 2011-01-03 ENCOUNTER — Encounter (HOSPITAL_BASED_OUTPATIENT_CLINIC_OR_DEPARTMENT_OTHER): Payer: No Typology Code available for payment source | Admitting: Oncology

## 2011-01-03 DIAGNOSIS — C50919 Malignant neoplasm of unspecified site of unspecified female breast: Secondary | ICD-10-CM

## 2011-01-03 DIAGNOSIS — F329 Major depressive disorder, single episode, unspecified: Secondary | ICD-10-CM

## 2011-01-03 LAB — COMPREHENSIVE METABOLIC PANEL
ALT: 16 U/L (ref 0–35)
AST: 24 U/L (ref 0–37)
Albumin: 4.3 g/dL (ref 3.5–5.2)
Alkaline Phosphatase: 78 U/L (ref 39–117)
BUN: 10 mg/dL (ref 6–23)
CO2: 26 mEq/L (ref 19–32)
Calcium: 9 mg/dL (ref 8.4–10.5)
Chloride: 101 mEq/L (ref 96–112)
Creatinine, Ser: 0.94 mg/dL (ref 0.50–1.10)
Glucose, Bld: 53 mg/dL — ABNORMAL LOW (ref 70–99)
Potassium: 3.2 mEq/L — ABNORMAL LOW (ref 3.5–5.3)
Sodium: 139 mEq/L (ref 135–145)
Total Bilirubin: 0.9 mg/dL (ref 0.3–1.2)
Total Protein: 7 g/dL (ref 6.0–8.3)

## 2011-01-03 LAB — CBC WITH DIFFERENTIAL/PLATELET
BASO%: 0.6 % (ref 0.0–2.0)
Basophils Absolute: 0 10*3/uL (ref 0.0–0.1)
EOS%: 2.7 % (ref 0.0–7.0)
Eosinophils Absolute: 0.1 10*3/uL (ref 0.0–0.5)
HCT: 40.6 % (ref 34.8–46.6)
HGB: 14 g/dL (ref 11.6–15.9)
LYMPH%: 32.4 % (ref 14.0–49.7)
MCH: 31.4 pg (ref 25.1–34.0)
MCHC: 34.6 g/dL (ref 31.5–36.0)
MCV: 90.8 fL (ref 79.5–101.0)
MONO#: 0.4 10*3/uL (ref 0.1–0.9)
MONO%: 8 % (ref 0.0–14.0)
NEUT#: 2.8 10*3/uL (ref 1.5–6.5)
NEUT%: 56.3 % (ref 38.4–76.8)
Platelets: 159 10*3/uL (ref 145–400)
RBC: 4.47 10*6/uL (ref 3.70–5.45)
RDW: 12.8 % (ref 11.2–14.5)
WBC: 4.9 10*3/uL (ref 3.9–10.3)
lymph#: 1.6 10*3/uL (ref 0.9–3.3)

## 2011-01-03 LAB — CANCER ANTIGEN 27.29: CA 27.29: 19 U/mL (ref 0–39)

## 2011-02-08 ENCOUNTER — Telehealth: Payer: Self-pay | Admitting: *Deleted

## 2011-02-08 NOTE — Telephone Encounter (Signed)
CALLED PATIENT PATIENT STATED THAT SHE CALLED IN ON 01-31-2011 AND CANCELLED HER APPOINTMENT PER PATIENT WAS SICK AND COULD NOT MAKE IT DID NOT WANT TO RESCHEDULE FOR 02-14-2011 TOOK ME TO GIVE NANCY RUDOLPH THE MESSAGE THAT THE MEDICATION SHE GAVE HER WAS DOING JUST FINE.

## 2011-02-14 ENCOUNTER — Ambulatory Visit: Payer: No Typology Code available for payment source

## 2011-02-27 ENCOUNTER — Other Ambulatory Visit: Payer: Self-pay | Admitting: *Deleted

## 2011-02-27 DIAGNOSIS — C50919 Malignant neoplasm of unspecified site of unspecified female breast: Secondary | ICD-10-CM

## 2011-02-27 NOTE — Telephone Encounter (Signed)
Received refill authorization request from [facility] for Wellbutrin 150 mg SR 1 tab BID originally prescribed by Barbee Cough on 01/03/11 & last filled on 02/05/11. Will send request to [facility] for approval.

## 2011-02-28 MED ORDER — BUPROPION HCL ER (SR) 150 MG PO TB12
150.0000 mg | ORAL_TABLET | Freq: Two times a day (BID) | ORAL | Status: DC
Start: 2011-02-27 — End: 2011-06-05

## 2011-06-05 ENCOUNTER — Other Ambulatory Visit: Payer: Self-pay | Admitting: *Deleted

## 2011-06-05 ENCOUNTER — Encounter: Payer: Self-pay | Admitting: *Deleted

## 2011-06-05 DIAGNOSIS — C50919 Malignant neoplasm of unspecified site of unspecified female breast: Secondary | ICD-10-CM

## 2011-06-05 MED ORDER — BUPROPION HCL ER (SR) 150 MG PO TB12: 150.0000 mg | ORAL_TABLET | Freq: Two times a day (BID) | ORAL | Status: DC

## 2011-07-03 ENCOUNTER — Telehealth: Payer: Self-pay | Admitting: *Deleted

## 2011-07-03 NOTE — Telephone Encounter (Signed)
left voice messsage to inform the patient of the new date and time asked patient to please call me back and let me know she did recieve the message

## 2011-07-04 ENCOUNTER — Ambulatory Visit: Payer: No Typology Code available for payment source | Admitting: Family

## 2011-07-04 ENCOUNTER — Ambulatory Visit: Payer: No Typology Code available for payment source | Admitting: Physician Assistant

## 2011-07-11 ENCOUNTER — Ambulatory Visit (HOSPITAL_BASED_OUTPATIENT_CLINIC_OR_DEPARTMENT_OTHER): Payer: No Typology Code available for payment source | Admitting: Lab

## 2011-07-11 ENCOUNTER — Ambulatory Visit (HOSPITAL_BASED_OUTPATIENT_CLINIC_OR_DEPARTMENT_OTHER): Payer: No Typology Code available for payment source | Admitting: Family

## 2011-07-11 ENCOUNTER — Telehealth: Payer: Self-pay | Admitting: *Deleted

## 2011-07-11 VITALS — BP 145/87 | HR 82 | Temp 98.2°F | Ht 66.3 in | Wt 192.2 lb

## 2011-07-11 DIAGNOSIS — C50919 Malignant neoplasm of unspecified site of unspecified female breast: Secondary | ICD-10-CM

## 2011-07-11 DIAGNOSIS — F329 Major depressive disorder, single episode, unspecified: Secondary | ICD-10-CM

## 2011-07-11 DIAGNOSIS — Z79811 Long term (current) use of aromatase inhibitors: Secondary | ICD-10-CM

## 2011-07-11 DIAGNOSIS — N9489 Other specified conditions associated with female genital organs and menstrual cycle: Secondary | ICD-10-CM

## 2011-07-11 DIAGNOSIS — F32A Depression, unspecified: Secondary | ICD-10-CM

## 2011-07-11 MED ORDER — BUPROPION HCL ER (SR) 200 MG PO TB12: 200.0000 mg | ORAL_TABLET | Freq: Two times a day (BID) | ORAL | Status: DC

## 2011-07-11 MED ORDER — LETROZOLE 2.5 MG PO TABS: 2.5000 mg | ORAL_TABLET | Freq: Every day | ORAL | Status: DC

## 2011-07-11 NOTE — Telephone Encounter (Signed)
gave patient appointment for 12-2011 sent patient back to the lab on 07-11-2011

## 2011-07-12 ENCOUNTER — Encounter: Payer: Self-pay | Admitting: Family

## 2011-07-12 DIAGNOSIS — C50511 Malignant neoplasm of lower-outer quadrant of right female breast: Secondary | ICD-10-CM | POA: Insufficient documentation

## 2011-07-12 DIAGNOSIS — F329 Major depressive disorder, single episode, unspecified: Secondary | ICD-10-CM | POA: Insufficient documentation

## 2011-07-12 DIAGNOSIS — F32A Depression, unspecified: Secondary | ICD-10-CM | POA: Insufficient documentation

## 2011-07-12 LAB — COMPREHENSIVE METABOLIC PANEL
ALT: 19 U/L (ref 0–35)
AST: 24 U/L (ref 0–37)
Albumin: 4.1 g/dL (ref 3.5–5.2)
Alkaline Phosphatase: 73 U/L (ref 39–117)
BUN: 16 mg/dL (ref 6–23)
CO2: 29 mEq/L (ref 19–32)
Calcium: 9.3 mg/dL (ref 8.4–10.5)
Chloride: 103 mEq/L (ref 96–112)
Creatinine, Ser: 1.16 mg/dL — ABNORMAL HIGH (ref 0.50–1.10)
Glucose, Bld: 93 mg/dL (ref 70–99)
Potassium: 3.6 mEq/L (ref 3.5–5.3)
Sodium: 140 mEq/L (ref 135–145)
Total Bilirubin: 0.7 mg/dL (ref 0.3–1.2)
Total Protein: 6.8 g/dL (ref 6.0–8.3)

## 2011-07-12 LAB — VITAMIN D 25 HYDROXY (VIT D DEFICIENCY, FRACTURES): Vit D, 25-Hydroxy: 48 ng/mL (ref 30–89)

## 2011-07-12 NOTE — Progress Notes (Signed)
[facility]  Name: Ashley Mann                  DATE: 07/12/2011 MRN: [id number]                      DOB: 09/13/1960           DIAGNOSIS: Patient Active Problem List  Diagnoses Date Noted  . Breast cancer 07/12/2011  . Depression 07/12/2011     Encounter Diagnoses  Name Primary?  . Breast cancer Yes  . Depression    PREVIOUS THERAPY:  1. Right mastectomy with TRAM reconstucton, 2006.  2. Adjuvant chemotherapy with Taxotere, Adriamycin, Cytoxan for 6 cycles.  3. Radiation therapy 4. Taxoxifen for 2 years. 5. Switched to Letrozole 2.5 mg in January 2010.   CURRENT THERAPY: Letrozole 2.5 mg daily   INTERIM HISTORY: Reports doing well. No self-detected breast complaints. On last visit, I had advised weaning Effexor and beginning Wellbutrin SR 300 mg daily. She reports improvement in mood on Wellbutrin, although cries easily. No weight loss or noticeable increase in libido. Has begun a walking regimen now that the weather has improved, in an effort to lose weight. I encourage this.  Occasional hot flash, not worse since discontinuing Effexor. Remains on Letrozole 2.5 mg daily, no arthralgias/myalgias. Vaginal dryness is moderate - severe, uses Astroglide for lubrication. No UTI.   No headache or blurred vision. No cough or shortness of breath. No abdominal pain or new bone pain. Bowel and bladder function are normal. Appetite is good, with adequate fluid intake. Remainder of the 10 point  review of systems is negative.  PHYSICAL EXAM: BP 145/87  Pulse 82  Temp 98.2 F (36.8 C)  Ht 5' 6.3" (1.684 m)  Wt 192 lb 3.2 oz (87.181 kg)  BMI 30.74 kg/m2 General: Well developed, well nourished, in no acute distress.  EENT: No ocular or oral lesions. No stomatitis.  Respiratory: Lungs are clear to auscultation bilaterally with normal respiratory movement and no accessory muscle use. Cardiac: No murmur, rub or tachycardia. No upper or lower extremity edema.  GI:  Abdomen is soft, no palpable hepatosplenomegaly. No fluid wave. No tenderness. Musculoskeletal: No kyphosis, no tenderness over the spine, ribs or hips. Lymph: No cervical, infraclavicular, axillary or inguinal adenopathy. Neuro: No focal neurological deficits. Psych: Alert and oriented X 3, appropriate mood and affect.  BREAST EXAM: In the supine position, with the right arm over the head, the right breast is reconstructed with TRAM, breast is soft and pliant. No redness of the skin. No nodularity. No right axillary adenopathy. With the left arm over the head, the left nipple is everted. No periareolar edema or nipple discharge. No mass in any quadrant or subareolar region. No redness of the skin. No left axillary adenopathy.    LABORATORY STUDIES:   Lab Results  Component Value Date   GLUCOSE 93 07/11/2011   ALT 19 07/11/2011   AST 24 07/11/2011   NA 140 07/11/2011   K 3.6 07/11/2011   CL 103 07/11/2011   CREATININE 1.16* 07/11/2011   BUN 16 07/11/2011   CO2 29 07/11/2011    IMPRESSION:  51 year old female with: 1. History right breast cancer with MRM with TRAM reconstruction, no evidence of disease radiographically and clinically.   2. Mammo 10/24/10, no evidence of malignancy.  3. Good tolerance of Letrozole.  4. Depression, improved but not resolved with Wellbutrin.  5. Vaginal dryness  PLAN:   1. Increase Wellbutrin SR to 400 mg daily. 2. Return to clinic in 6 months to see Dr. Welton Flakes with lab prior.  3. Check Vit D today 4. Try Vit E vaginally for vaginal dryness.   REFERRAL:  DISCUSSION:

## 2011-07-16 ENCOUNTER — Telehealth: Payer: Self-pay | Admitting: *Deleted

## 2011-07-16 NOTE — Telephone Encounter (Signed)
Message copied by Cooper Render on Tue Jul 16, 2011  2:10 PM ------      Message from: Theotis Barrio      Created: Fri Jul 12, 2011  3:39 PM       Please let her know her liver function and vitamin D level are good. She is to stay on current dose of Vit. D.

## 2011-07-16 NOTE — Telephone Encounter (Signed)
Per NR, notified pt liver function and vitamin D level are good. To stay on current dose of Vitamin D. Pt verbalized understanding.

## 2011-07-29 ENCOUNTER — Telehealth: Payer: Self-pay | Admitting: Medical Oncology

## 2011-07-29 NOTE — Telephone Encounter (Signed)
Patient requested lab results be faxed to family MD.  Returned call this AM and patient stated "I just talked to medical records and they faxed them this morning."

## 2011-08-12 ENCOUNTER — Other Ambulatory Visit: Payer: Self-pay | Admitting: Medical Oncology

## 2011-08-13 ENCOUNTER — Telehealth: Payer: Self-pay | Admitting: Medical Oncology

## 2011-08-13 NOTE — Telephone Encounter (Signed)
Received call for a return call from nurse.  Attempted to contact patient however no answer.  LMOVM for patient to return call to this nurse.

## 2011-08-30 ENCOUNTER — Other Ambulatory Visit: Payer: Self-pay | Admitting: *Deleted

## 2011-08-30 NOTE — Telephone Encounter (Signed)
Pt called states "I need a refill on my Letrozole and I only received a 30 day supply the last time. I'm supposed to get a 90 day supply." Pt requested her pharmacy be called "to find out what happened" Discussed with pt the Rx ordered was a 30 day and we are not able to go back to an old Rx and add to the quantity previously ordered Called Pt's pharmacy who verified this was a 30 day supply and previous Rx's have been for a 90 day supply. A new Rx will need to be written a 90 day supply with each refill. Pt agreed going forward she would like to have a 90 day supply as she has in the past. Asked pt if she would like me to call this Rx in as she requested. Pt denied needing a refill states " I still have 20 left over from when Dr. Welton Flakes wrote me the Rx and I have 30 day supply which is the one I just got in the mail. Discussed with pt when she is close to running out of medication to call back and advise. She may need to notify us approx 10-12 days prior to running out of medication as this will be coming to her in the mail and may take a week to receive. Pt denied needing further assistance at this time or a refill on her Letrozole, pt verbalized she will call when she is ready for a refill.

## 2011-10-09 ENCOUNTER — Other Ambulatory Visit: Payer: Self-pay | Admitting: *Deleted

## 2011-10-09 MED ORDER — LETROZOLE 2.5 MG PO TABS: 2.5000 mg | ORAL_TABLET | Freq: Every day | ORAL | Status: AC

## 2011-10-09 NOTE — Telephone Encounter (Signed)
Pt called with request for refill,  Reviewed with MD VO ok to refill Femara Q#90 90 refills 4 [facility] per pt request

## 2011-10-29 ENCOUNTER — Other Ambulatory Visit: Payer: Self-pay | Admitting: Oncology

## 2011-10-29 DIAGNOSIS — Z1231 Encounter for screening mammogram for malignant neoplasm of breast: Secondary | ICD-10-CM

## 2011-11-20 ENCOUNTER — Telehealth: Payer: Self-pay | Admitting: *Deleted

## 2011-11-20 NOTE — Telephone Encounter (Signed)
Pt called lmovm " I have questions about my medications. You can reach me at [phone]." Call to pt regarding her concern. No answer at [phone]. LMOVM for pt to call back at her conveince

## 2011-12-10 ENCOUNTER — Ambulatory Visit
Admission: RE | Admit: 2011-12-10 | Discharge: 2011-12-10 | Disposition: A | Discharge status: Home / Self Care | Payer: No Typology Code available for payment source | Source: Ambulatory Visit | Attending: Oncology | Admitting: Oncology

## 2011-12-10 DIAGNOSIS — Z1231 Encounter for screening mammogram for malignant neoplasm of breast: Secondary | ICD-10-CM

## 2011-12-11 ENCOUNTER — Telehealth: Payer: Self-pay | Admitting: Medical Oncology

## 2011-12-11 NOTE — Telephone Encounter (Signed)
LMOVM, per MD, no cancer recurrence.  Patient to call back confirming message and with any questions or concerns.

## 2011-12-11 NOTE — Telephone Encounter (Signed)
Message copied by Tylene Fantasia on Wed Dec 11, 2011  4:18 PM ------      Message from: Victorino December      Created: Wed Dec 11, 2011  4:07 PM       Please call patient: no cancer recurrence

## 2011-12-11 NOTE — Telephone Encounter (Signed)
LMOVM, per MD, patient to keep appointments as scheduled and call clinic if there is any redness or pain.  Instructed patient to call with any further questions or concerns.

## 2012-01-16 ENCOUNTER — Telehealth: Payer: Self-pay | Admitting: *Deleted

## 2012-01-16 ENCOUNTER — Other Ambulatory Visit (HOSPITAL_BASED_OUTPATIENT_CLINIC_OR_DEPARTMENT_OTHER): Payer: No Typology Code available for payment source | Admitting: Lab

## 2012-01-16 ENCOUNTER — Ambulatory Visit (HOSPITAL_BASED_OUTPATIENT_CLINIC_OR_DEPARTMENT_OTHER): Payer: No Typology Code available for payment source | Admitting: Oncology

## 2012-01-16 ENCOUNTER — Encounter: Payer: Self-pay | Admitting: Oncology

## 2012-01-16 VITALS — BP 164/99 | HR 85 | Temp 98.3°F | Resp 20 | Ht 66.3 in | Wt 185.9 lb

## 2012-01-16 DIAGNOSIS — F329 Major depressive disorder, single episode, unspecified: Secondary | ICD-10-CM

## 2012-01-16 DIAGNOSIS — C50919 Malignant neoplasm of unspecified site of unspecified female breast: Secondary | ICD-10-CM

## 2012-01-16 DIAGNOSIS — F32A Depression, unspecified: Secondary | ICD-10-CM

## 2012-01-16 DIAGNOSIS — F3289 Other specified depressive episodes: Secondary | ICD-10-CM

## 2012-01-16 DIAGNOSIS — Z170 Estrogen receptor positive status [ER+]: Secondary | ICD-10-CM

## 2012-01-16 DIAGNOSIS — E559 Vitamin D deficiency, unspecified: Secondary | ICD-10-CM

## 2012-01-16 DIAGNOSIS — F411 Generalized anxiety disorder: Secondary | ICD-10-CM

## 2012-01-16 LAB — COMPREHENSIVE METABOLIC PANEL (CC13)
ALT: 20 U/L (ref 0–55)
AST: 20 U/L (ref 5–34)
Albumin: 3.8 g/dL (ref 3.5–5.0)
Alkaline Phosphatase: 88 U/L (ref 40–150)
BUN: 15 mg/dL (ref 7.0–26.0)
CO2: 27 mEq/L (ref 22–29)
Calcium: 9.3 mg/dL (ref 8.4–10.4)
Chloride: 103 mEq/L (ref 98–107)
Creatinine: 1.2 mg/dL — ABNORMAL HIGH (ref 0.6–1.1)
Glucose: 66 mg/dl — ABNORMAL LOW (ref 70–99)
Potassium: 3.5 mEq/L (ref 3.5–5.1)
Sodium: 139 mEq/L (ref 136–145)
Total Bilirubin: 1.1 mg/dL (ref 0.20–1.20)
Total Protein: 6.8 g/dL (ref 6.4–8.3)

## 2012-01-16 MED ORDER — BUPROPION HCL ER (SR) 200 MG PO TB12: 200.0000 mg | ORAL_TABLET | Freq: Two times a day (BID) | ORAL | Status: DC

## 2012-01-16 NOTE — Telephone Encounter (Signed)
Gave patient appointment for 04-17-2012 starting at 10:30am

## 2012-01-16 NOTE — Patient Instructions (Addendum)
Continue the letrozole.  I will see you back in 3 months for follow up to re-evaluate the left breast.

## 2012-01-20 ENCOUNTER — Encounter: Payer: Self-pay | Admitting: *Deleted

## 2012-01-20 ENCOUNTER — Telehealth: Payer: Self-pay | Admitting: *Deleted

## 2012-01-20 NOTE — Telephone Encounter (Signed)
CALLED PATIENT LEFT VOICE MESSAGE TO INFORM THE PATIENT OF THE LAB ONLY APPOINTMENT LAB ORDERS FROM OUTSIDE IN THE SYSTEM

## 2012-01-21 ENCOUNTER — Encounter: Payer: Self-pay | Admitting: Oncology

## 2012-01-21 ENCOUNTER — Telehealth: Payer: Self-pay | Admitting: *Deleted

## 2012-01-21 NOTE — Telephone Encounter (Signed)
Pt called with concerns regarding lab appt on 10/24.  Discussed with pt Fax received from [facility] clinic on 01/20/12 for pt to have hepatic panel, lipid w/ HDL/LDL. Lab appt was scheduled based on this order from Dr. Yetta Barre. Pt advised she had these labs drawn on 10/17, as she was at the lab at the time the fax was sent with the request. Apologized to pt as I was not aware these labs had been drawn. Lab appt on 10/24 will be cancelled.  Pt questioned results stating " no one has called me about the results." Discussed with pt, results were sent to Dr. Yetta Barre office and she will need to contact them for results.  Pt questioned her CMET results drawn here on 10/17. Pt states, my glucose was low 66 and my creatine was high 1.2. Discussed with pt I will review her concerns with MD and call back with further information.

## 2012-01-21 NOTE — Telephone Encounter (Signed)
Error

## 2012-01-21 NOTE — Telephone Encounter (Signed)
Spoke to patient

## 2012-01-21 NOTE — Telephone Encounter (Signed)
Spoke to patient about her results. She will continue to follow up with her PCP  Drue Second, MD Medical/Oncology [facility] [phone] (beeper) [phone] (Office)  01/21/2012, 1:13 PM

## 2012-01-22 NOTE — Progress Notes (Signed)
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 Postmastectomy she received adjuvant chemotherapy consisting of Taxotere Adriamycin Cytoxan for a total of 6 cycles followed by radiation.  #2 she received tamoxifen for 2 years and then switch to letrozole 2.5 mg in January 2010.  #3 anxiety and depression patient is currently on Wellbutrin.  CURRENT THERAPY: Letrozole 2.5 mg daily  INTERVAL HISTORY: Ashley Mann 51 y.o. female returns for followup visit today. Her main complaint is hot flashes and depression. She also tells me that her left nipple has been itching she had an extensive workup including MRIs and biopsies in that was negative. She otherwise has no nausea or vomiting no fevers chills no night sweats she does have hot flashes. She does have aches and pains.  MEDICAL HISTORY: Past Medical History  Diagnosis Date  . Genital herpes in women   . Depression   . Breast cancer   . History of unilateral modified radical mastectomy 11/21/04    ALLERGIES:   has no known allergies.  MEDICATIONS:  Current Outpatient Prescriptions  Medication Sig Dispense Refill  . acyclovir (ZOVIRAX) 200 MG capsule Take 200 mg by mouth daily.      Marland Kitchen amLODipine (NORVASC) 5 MG tablet Take 5 mg by mouth daily.      Marland Kitchen letrozole (FEMARA) 2.5 MG tablet Take 2.5 mg by mouth daily.      . simvastatin (ZOCOR) 40 MG tablet Take 40 mg by mouth every evening.      . Ascorbic Acid 1000 MG CHEW Chew 1 each by mouth.      Marland Kitchen buPROPion (WELLBUTRIN SR) 200 MG 12 hr tablet Take 1 tablet (200 mg total) by mouth 2 (two) times daily.  60 tablet   5  . Calcium Carbonate-Vitamin D (CALCIUM-VITAMIN D) 500-200 MG-UNIT per tablet Take 1 tablet by mouth 2 (two) times daily with a meal.      . Multiple Vitamin (MULTIVITAMIN) tablet Take 1 tablet by mouth daily.        SURGICAL HISTORY:  Past Surgical History  Procedure Date  . Modified radical mastectomy w/ axillary lymph node dissection 11/21/04    Right. With TRAM reconstruction.   . Abdominal hysterectomy   . Cholecystectomy     REVIEW OF SYSTEMS:  Pertinent items are noted in HPI.   HEALTH MAINTENANCE:  PHYSICAL EXAMINATION: Blood pressure 164/99, pulse 85, temperature 98.3 F (36.8 C), resp. rate 20, height 5' 6.3" (1.684 m), weight 185 lb 14.4 oz (84.324 kg). Body mass index is 29.73 kg/(m^2). ECOG PERFORMANCE STATUS: 1 - Symptomatic but completely ambulatory Well-developed well-nourished female HEENT exam EOMI PERRLA sclerae anicteric no conjunctival pallor oral mucosa is moist neck is supple no palpable adenopathy lungs clear cardiovascular is regular rate rhythm abdomen is soft nontender no HSM extremities no edema neuro is nonfocal left breast no nipple discharge no skin changes no palpable lymph nodes or masses. Right reconstructed breast is well healed no masses.     LABORATORY DATA: Lab Results  Component Value Date   WBC 4.9 01/03/2011   HGB 14.0 01/03/2011   HCT 40.6 01/03/2011   MCV 90.8 01/03/2011  PLT 159 01/03/2011      Chemistry      Component Value Date/Time   NA 139 01/16/2012 1146   NA 140 07/11/2011 1429   NA 144 11/16/2009 1315   K 3.5 01/16/2012 1146   K 3.6 07/11/2011 1429   K 3.9 11/16/2009 1315   CL 103 01/16/2012 1146   CL 103 07/11/2011 1429   CL 102 11/16/2009 1315   CO2 27 01/16/2012 1146   CO2 29 07/11/2011 1429   CO2 29 11/16/2009 1315   BUN 15.0 01/16/2012 1146   BUN 16 07/11/2011 1429   BUN 14 11/16/2009 1315   CREATININE 1.2* 01/16/2012 1146   CREATININE 1.16* 07/11/2011 1429   CREATININE 0.9 11/16/2009 1315      Component Value  Date/Time   CALCIUM 9.3 01/16/2012 1146   CALCIUM 9.3 07/11/2011 1429   CALCIUM 9.1 11/16/2009 1315   ALKPHOS 88 01/16/2012 1146   ALKPHOS 73 07/11/2011 1429   ALKPHOS 69 11/16/2009 1315   AST 20 01/16/2012 1146   AST 24 07/11/2011 1429   AST 25 11/16/2009 1315   ALT 20 01/16/2012 1146   ALT 19 07/11/2011 1429   BILITOT 1.10 01/16/2012 1146   BILITOT 0.7 07/11/2011 1429   BILITOT 1.00 11/16/2009 1315       RADIOGRAPHIC STUDIES:  No results found.  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 She underwent a mastectomy followed by adjuvant chemotherapy consisting of 6 cycles of Taxotere Adriamycin and Cytoxan. She then received post mastectomy radiation therapy followed by reconstruction. She is now on letrozole 2.5 mg since January 2010. She does have itching of the left breast nipple she has had an extensive workup without any significant findings.   PLAN:   #1 she will continue the letrozole at this time.  #2 I will see her back in about 3 months time for followup. She and I did discuss doing an MRI of the left breast however she wants to see what happens in about 3 months time. She is very closely followed by Dr. Jamey Ripa.   All questions were answered. The patient knows to call the clinic with any problems, questions or concerns. We can certainly see the patient much sooner if necessary.  I spent 25 minutes counseling the patient face to face. The total time spent in the appointment was 30 minutes.    Drue Second, MD Medical/Oncology [facility] [phone] (beeper) [phone] (Office)

## 2012-01-23 ENCOUNTER — Other Ambulatory Visit: Payer: No Typology Code available for payment source | Admitting: Lab

## 2012-01-23 ENCOUNTER — Telehealth: Payer: Self-pay | Admitting: *Deleted

## 2012-01-23 NOTE — Telephone Encounter (Signed)
Pt called lmovm " I have a question about my labs?" Returned pt's call [phone]. Unable to reach pt. lmovm

## 2012-04-14 ENCOUNTER — Telehealth: Payer: Self-pay | Admitting: *Deleted

## 2012-04-14 NOTE — Telephone Encounter (Signed)
Patient reschedule to [phone]

## 2012-04-17 ENCOUNTER — Ambulatory Visit: Payer: No Typology Code available for payment source | Admitting: Physician Assistant

## 2012-04-17 ENCOUNTER — Other Ambulatory Visit: Payer: No Typology Code available for payment source

## 2012-05-06 ENCOUNTER — Other Ambulatory Visit: Payer: Self-pay | Admitting: Oncology

## 2012-05-06 ENCOUNTER — Telehealth: Payer: Self-pay | Admitting: Oncology

## 2012-05-06 ENCOUNTER — Encounter: Payer: Self-pay | Admitting: Oncology

## 2012-05-06 DIAGNOSIS — C50919 Malignant neoplasm of unspecified site of unspecified female breast: Secondary | ICD-10-CM

## 2012-05-06 NOTE — Telephone Encounter (Signed)
lmonvm for pt re appt for 2/18 @ 3pm w/Dr. Dwain Sarna. Pt identified on vm and given # for [facility] to r/s if she cannot kwwp appt.

## 2012-05-16 ENCOUNTER — Other Ambulatory Visit: Payer: Self-pay

## 2012-05-19 ENCOUNTER — Ambulatory Visit (INDEPENDENT_AMBULATORY_CARE_PROVIDER_SITE_OTHER): Payer: No Typology Code available for payment source | Admitting: General Surgery

## 2012-05-19 ENCOUNTER — Encounter (INDEPENDENT_AMBULATORY_CARE_PROVIDER_SITE_OTHER): Payer: Self-pay | Admitting: General Surgery

## 2012-05-19 VITALS — BP 152/90 | HR 80 | Temp 97.6°F | Resp 16 | Ht 67.0 in | Wt 183.4 lb

## 2012-05-19 DIAGNOSIS — C50919 Malignant neoplasm of unspecified site of unspecified female breast: Secondary | ICD-10-CM

## 2012-05-19 NOTE — Progress Notes (Signed)
Patient ID: Ashley Mann, female   DOB: [birth year], 52 y.o.   MRN: [id number]  Chief Complaint  Patient presents with  . New Evaluation    eval for poss padgetts disease    HPI Ashley Mann is a 52 y.o. female.  Referred by Dr. Drue Second HPI 67 yof who was taken care of by Dr. Welton Flakes and a retired Careers adviser in 2006 with right mrm followed by adjuvant therapy.  She is now on letrozole.  She had a multifocal hormone receptor positive tumor with positive nodes.  She underwent tram reconstruction as well as left sided reduction by Dr. Odis Luster.  For the last 10 months she has had intermittent itching of her left nipple and a "prickly" sensation under her nipple areolar complex.  She does not note a mass She has no nipple discharge. She has no crusting or any findings on her bra either.  She is concerned that this is not better.  There are no real aggravating or relieving factors at all.  Past Medical History  Diagnosis Date  . Genital herpes in women   . Depression   . Breast cancer   . History of unilateral modified radical mastectomy 11/21/04  . Hyperlipidemia   . Hypertension     Past Surgical History  Procedure Laterality Date  . Modified radical mastectomy w/ axillary lymph node dissection  11/21/04    Right. With TRAM reconstruction.   . Abdominal hysterectomy    . Cholecystectomy    . Breast surgery      rt maste  . Reduction mammaplasty      reduction  . Breast reconstruction    . Oophorectomy    . Salpingoophorectomy    . Cesarean section      Family History  Problem Relation Age of Onset  . Cancer Father     colon  . Cancer Paternal Aunt     lung  . Cancer Maternal Grandmother     ovarian    Social History History  Substance Use Topics  . Smoking status: Former Smoker -- 1.00 packs/day for 25 years  . Smokeless tobacco: Never Used  . Alcohol Use: No    No Known Allergies  Current Outpatient Prescriptions  Medication Sig Dispense Refill  . acyclovir  (ZOVIRAX) 200 MG capsule Take 200 mg by mouth daily.      Marland Kitchen amLODipine (NORVASC) 5 MG tablet Take 5 mg by mouth daily.      . Ascorbic Acid 1000 MG CHEW Chew 1 each by mouth.      Marland Kitchen buPROPion (WELLBUTRIN SR) 200 MG 12 hr tablet Take 1 tablet (200 mg total) by mouth 2 (two) times daily.  60 tablet  5  . Calcium Carbonate-Vitamin D (CALCIUM-VITAMIN D) 500-200 MG-UNIT per tablet Take 1 tablet by mouth 2 (two) times daily with a meal.      . letrozole (FEMARA) 2.5 MG tablet Take 2.5 mg by mouth daily.      . Multiple Vitamin (MULTIVITAMIN) tablet Take 1 tablet by mouth daily.      . simvastatin (ZOCOR) 40 MG tablet Take 40 mg by mouth every evening.       No current facility-administered medications for this visit.    Review of Systems Review of Systems  Constitutional: Negative for fever, chills and unexpected weight change.  HENT: Negative for hearing loss, congestion, sore throat, trouble swallowing and voice change.   Eyes: Negative for visual disturbance.  Respiratory: Negative for cough and wheezing.  Cardiovascular: Positive for chest pain and leg swelling. Negative for palpitations.  Gastrointestinal: Negative for nausea, vomiting, abdominal pain, diarrhea, constipation, blood in stool, abdominal distention and anal bleeding.  Genitourinary: Negative for hematuria, vaginal bleeding and difficulty urinating.  Musculoskeletal: Negative for arthralgias.  Skin: Negative for rash and wound.  Neurological: Positive for light-headedness. Negative for seizures, syncope and headaches.  Hematological: Negative for adenopathy. Bruises/bleeds easily.  Psychiatric/Behavioral: Negative for confusion.    Blood pressure 152/90, pulse 80, temperature 97.6 F (36.4 C), temperature source Temporal, resp. rate 16, height 5\' 7"  (1.702 m), weight 183 lb 6.4 oz (83.19 kg).  Physical Exam Physical Exam  Constitutional: She appears well-developed and well-nourished.  Cardiovascular: Normal rate,  regular rhythm and normal heart sounds.   Pulmonary/Chest: Effort normal and breath sounds normal. Right breast exhibits no mass and no tenderness. Left breast exhibits no inverted nipple, no mass, no nipple discharge, no skin change and no tenderness.    Lymphadenopathy:    She has no cervical adenopathy.    She has no axillary adenopathy.       Right: No supraclavicular adenopathy present.       Left: No supraclavicular adenopathy present.    Data Reviewed Prior mmg/us, old notes from paper chart  Assessment    History right breast cancer Nipple itching    Plan    I don't find anything abnormal on exam and has nl mmg.  She does however have continued itching and I think with history of contralateral breast cancer including pagets disease it would be most prudent to proceed with an mri and then have her return after that.        WAKEFIELD,MATTHEW 05/19/2012, 9:36 PM

## 2012-05-20 ENCOUNTER — Telehealth (INDEPENDENT_AMBULATORY_CARE_PROVIDER_SITE_OTHER): Payer: Self-pay

## 2012-05-20 DIAGNOSIS — C50911 Malignant neoplasm of unspecified site of right female breast: Secondary | ICD-10-CM

## 2012-05-22 NOTE — Telephone Encounter (Signed)
Error

## 2012-06-03 ENCOUNTER — Other Ambulatory Visit (INDEPENDENT_AMBULATORY_CARE_PROVIDER_SITE_OTHER): Payer: Self-pay | Admitting: General Surgery

## 2012-06-03 ENCOUNTER — Ambulatory Visit
Admission: RE | Admit: 2012-06-03 | Discharge: 2012-06-03 | Disposition: A | Discharge status: Home / Self Care | Payer: No Typology Code available for payment source | Source: Ambulatory Visit | Attending: General Surgery | Admitting: General Surgery

## 2012-06-03 DIAGNOSIS — Z853 Personal history of malignant neoplasm of breast: Secondary | ICD-10-CM

## 2012-06-03 DIAGNOSIS — C50911 Malignant neoplasm of unspecified site of right female breast: Secondary | ICD-10-CM

## 2012-06-03 DIAGNOSIS — C50919 Malignant neoplasm of unspecified site of unspecified female breast: Secondary | ICD-10-CM

## 2012-06-03 MED ORDER — GADOBENATE DIMEGLUMINE 529 MG/ML IV SOLN
17.0000 mL | Freq: Once | INTRAVENOUS | Status: AC | PRN
  Administered 2012-06-03: 17 mL via INTRAVENOUS

## 2012-06-08 ENCOUNTER — Telehealth (INDEPENDENT_AMBULATORY_CARE_PROVIDER_SITE_OTHER): Payer: Self-pay | Admitting: *Deleted

## 2012-06-08 NOTE — Telephone Encounter (Signed)
Patient called to state she had a missed call from [place].  Patient states she saw her MRI test results on MyChart and saw that everything is fine.  Alisha updated that patient called and has her results.

## 2012-06-10 ENCOUNTER — Telehealth (INDEPENDENT_AMBULATORY_CARE_PROVIDER_SITE_OTHER): Payer: Self-pay

## 2012-06-10 NOTE — Telephone Encounter (Signed)
LMOM just making sure pt is ok with her MRI results and she has no questions. Dr Dwain Sarna just wanted to make sure that she didn't need another appt to come back in to the office to discuss any questions. I advised her that if she is ok with everything then we are to and we will not make her another appt at this time per Dr Dwain Sarna.

## 2012-06-30 ENCOUNTER — Telehealth: Payer: Self-pay | Admitting: *Deleted

## 2012-06-30 NOTE — Telephone Encounter (Signed)
Lm informing pt that Dr.KK will be on pal 07/13/12. gv her appt d/t for 09/28/12@2 :30pm.

## 2012-07-13 ENCOUNTER — Ambulatory Visit: Payer: No Typology Code available for payment source | Admitting: Oncology

## 2012-07-13 ENCOUNTER — Other Ambulatory Visit: Payer: No Typology Code available for payment source | Admitting: Lab

## 2012-07-16 ENCOUNTER — Ambulatory Visit: Payer: No Typology Code available for payment source | Admitting: Oncology

## 2012-07-23 ENCOUNTER — Other Ambulatory Visit: Payer: Self-pay | Admitting: *Deleted

## 2012-07-23 MED ORDER — BUPROPION HCL ER (SR) 200 MG PO TB12: 200.0000 mg | ORAL_TABLET | Freq: Two times a day (BID) | ORAL | Status: DC

## 2012-09-28 ENCOUNTER — Ambulatory Visit: Payer: No Typology Code available for payment source | Admitting: Oncology

## 2012-09-28 ENCOUNTER — Other Ambulatory Visit: Payer: No Typology Code available for payment source | Admitting: Lab

## 2012-09-29 ENCOUNTER — Telehealth: Payer: Self-pay | Admitting: Oncology

## 2012-10-23 ENCOUNTER — Ambulatory Visit (HOSPITAL_BASED_OUTPATIENT_CLINIC_OR_DEPARTMENT_OTHER): Payer: No Typology Code available for payment source | Admitting: Oncology

## 2012-10-23 ENCOUNTER — Telehealth: Payer: Self-pay | Admitting: Oncology

## 2012-10-23 ENCOUNTER — Other Ambulatory Visit (HOSPITAL_BASED_OUTPATIENT_CLINIC_OR_DEPARTMENT_OTHER): Payer: No Typology Code available for payment source | Admitting: Lab

## 2012-10-23 VITALS — BP 147/84 | HR 69 | Temp 97.9°F | Resp 20 | Ht 67.0 in | Wt 192.2 lb

## 2012-10-23 DIAGNOSIS — C50911 Malignant neoplasm of unspecified site of right female breast: Secondary | ICD-10-CM

## 2012-10-23 DIAGNOSIS — C50919 Malignant neoplasm of unspecified site of unspecified female breast: Secondary | ICD-10-CM

## 2012-10-23 DIAGNOSIS — E559 Vitamin D deficiency, unspecified: Secondary | ICD-10-CM

## 2012-10-23 LAB — CBC WITH DIFFERENTIAL/PLATELET
BASO%: 0.8 % (ref 0.0–2.0)
Basophils Absolute: 0 10*3/uL (ref 0.0–0.1)
EOS%: 3 % (ref 0.0–7.0)
Eosinophils Absolute: 0.1 10*3/uL (ref 0.0–0.5)
HCT: 40.5 % (ref 34.8–46.6)
HGB: 13.6 g/dL (ref 11.6–15.9)
LYMPH%: 29.8 % (ref 14.0–49.7)
MCH: 30.4 pg (ref 25.1–34.0)
MCHC: 33.7 g/dL (ref 31.5–36.0)
MCV: 90.4 fL (ref 79.5–101.0)
MONO#: 0.3 10*3/uL (ref 0.1–0.9)
MONO%: 6.7 % (ref 0.0–14.0)
NEUT#: 3 10*3/uL (ref 1.5–6.5)
NEUT%: 59.7 % (ref 38.4–76.8)
Platelets: 160 10*3/uL (ref 145–400)
RBC: 4.48 10*6/uL (ref 3.70–5.45)
RDW: 13.2 % (ref 11.2–14.5)
WBC: 5 10*3/uL (ref 3.9–10.3)
lymph#: 1.5 10*3/uL (ref 0.9–3.3)

## 2012-10-23 LAB — COMPREHENSIVE METABOLIC PANEL (CC13)
ALT: 21 U/L (ref 0–55)
AST: 25 U/L (ref 5–34)
Albumin: 3.9 g/dL (ref 3.5–5.0)
Alkaline Phosphatase: 72 U/L (ref 40–150)
BUN: 15.9 mg/dL (ref 7.0–26.0)
CO2: 26 mEq/L (ref 22–29)
Calcium: 9.2 mg/dL (ref 8.4–10.4)
Chloride: 104 mEq/L (ref 98–109)
Creatinine: 1.2 mg/dL — ABNORMAL HIGH (ref 0.6–1.1)
Glucose: 85 mg/dl (ref 70–140)
Potassium: 3.9 mEq/L (ref 3.5–5.1)
Sodium: 140 mEq/L (ref 136–145)
Total Bilirubin: 0.8 mg/dL (ref 0.20–1.20)
Total Protein: 7.4 g/dL (ref 6.4–8.3)

## 2012-10-23 MED ORDER — BUPROPION HCL ER (SR) 200 MG PO TB12: 200.0000 mg | ORAL_TABLET | Freq: Two times a day (BID) | ORAL | Status: DC

## 2012-10-23 MED ORDER — LETROZOLE 2.5 MG PO TABS: 2.5000 mg | ORAL_TABLET | Freq: Every day | ORAL | Status: DC

## 2012-10-23 NOTE — Telephone Encounter (Signed)
gv pt appt schedule for January 2015. °

## 2012-10-24 LAB — VITAMIN D 25 HYDROXY (VIT D DEFICIENCY, FRACTURES): Vit D, 25-Hydroxy: 45 ng/mL (ref 30–89)

## 2012-10-26 ENCOUNTER — Other Ambulatory Visit: Payer: Self-pay | Admitting: Emergency Medicine

## 2012-10-26 MED ORDER — LETROZOLE 2.5 MG PO TABS: 2.5000 mg | ORAL_TABLET | Freq: Every day | ORAL | Status: DC

## 2012-10-27 ENCOUNTER — Other Ambulatory Visit: Payer: Self-pay | Admitting: Emergency Medicine

## 2012-10-27 MED ORDER — ESTRADIOL 0.1 MG/GM VA CREA: TOPICAL_CREAM | VAGINAL | Status: DC

## 2012-11-08 NOTE — Progress Notes (Signed)
OFFICE PROGRESS NOTE  CC  [name],[name], MD 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 Postmastectomy she received adjuvant chemotherapy consisting of Taxotere Adriamycin Cytoxan for a total of 6 cycles followed by radiation.  #2 she received tamoxifen for 2 years and then switch to letrozole 2.5 mg in January 2010.  #3 anxiety and depression patient is currently on Wellbutrin.  CURRENT THERAPY: Letrozole 2.5 mg daily  INTERVAL HISTORY: Ashley Mann 52 y.o. female returns for followup visit today. Her main complaint is hot flashes and depression. She also tells me that her left nipple has been itching she had an extensive workup including MRIs and biopsies in that was negative. She otherwise has no nausea or vomiting no fevers chills no night sweats she does have hot flashes. She does have aches and pains.  MEDICAL HISTORY: Past Medical History  Diagnosis Date  . Genital herpes in women   . Depression   . Breast cancer   . History of unilateral modified radical mastectomy 11/21/04  . Hyperlipidemia   . Hypertension     ALLERGIES:  has No Known Allergies.  MEDICATIONS:  Current Outpatient Prescriptions  Medication Sig Dispense Refill  . acyclovir (ZOVIRAX) 200 MG capsule Take 200 mg by mouth daily.      Marland Kitchen amLODipine (NORVASC) 5 MG tablet Take 5 mg by mouth daily.      . Ascorbic Acid 1000 MG CHEW Chew 1 each by mouth.      Marland Kitchen buPROPion (WELLBUTRIN SR) 200 MG 12 hr tablet Take 1 tablet (200 mg total) by mouth 2 (two) times daily.  60 tablet  6  . cholecalciferol (VITAMIN D) 1000 UNITS tablet Take 1,000 Units by mouth daily.      . Multiple  Vitamin (MULTIVITAMIN) tablet Take 1 tablet by mouth daily.      . simvastatin (ZOCOR) 40 MG tablet Take 40 mg by mouth every evening.      . vitamin E 100 UNIT capsule Take 400 Units by mouth daily.      Marland Kitchen estradiol (ESTRACE VAGINAL) 0.1 MG/GM vaginal cream Insert 1/4 an applicator vaginally once weekly as needed.  42.5 g  12  . letrozole (FEMARA) 2.5 MG tablet Take 1 tablet (2.5 mg total) by mouth daily.  90 tablet  12   No current facility-administered medications for this visit.    SURGICAL HISTORY:  Past Surgical History  Procedure Laterality Date  . Modified radical mastectomy w/ axillary lymph node dissection  11/21/04    Right. With TRAM reconstruction.   . Abdominal hysterectomy    . Cholecystectomy    . Breast surgery      rt maste  . Reduction mammaplasty      reduction  . Breast reconstruction    . Oophorectomy    . Salpingoophorectomy    . Cesarean section      REVIEW OF SYSTEMS:  Pertinent items are noted in HPI.   HEALTH MAINTENANCE:  PHYSICAL EXAMINATION: Blood pressure 147/84, pulse 69, temperature 97.9 F (36.6 C), temperature source Oral, resp. rate 20, height 5\' 7"  (1.702 m), weight 192 lb 3.2 oz (87.181 kg). Body mass index is 30.1 kg/(m^2). ECOG PERFORMANCE STATUS: 1 -  Symptomatic but completely ambulatory Well-developed well-nourished female HEENT exam EOMI PERRLA sclerae anicteric no conjunctival pallor oral mucosa is moist neck is supple no palpable adenopathy lungs clear cardiovascular is regular rate rhythm abdomen is soft nontender no HSM extremities no edema neuro is nonfocal left breast no nipple discharge no skin changes no palpable lymph nodes or masses. Right reconstructed breast is well healed no masses.     LABORATORY DATA: Lab Results  Component Value Date   WBC 5.0 10/23/2012   HGB 13.6 10/23/2012   HCT 40.5 10/23/2012   MCV 90.4 10/23/2012   PLT 160 10/23/2012      Chemistry      Component Value Date/Time   NA 140 10/23/2012 1400    NA 140 07/11/2011 1429   NA 144 11/16/2009 1315   K 3.9 10/23/2012 1400   K 3.6 07/11/2011 1429   K 3.9 11/16/2009 1315   CL 103 01/16/2012 1146   CL 103 07/11/2011 1429   CL 102 11/16/2009 1315   CO2 26 10/23/2012 1400   CO2 29 07/11/2011 1429   CO2 29 11/16/2009 1315   BUN 15.9 10/23/2012 1400   BUN 16 07/11/2011 1429   BUN 14 11/16/2009 1315   CREATININE 1.2* 10/23/2012 1400   CREATININE 1.16* 07/11/2011 1429   CREATININE 0.9 11/16/2009 1315      Component Value Date/Time   CALCIUM 9.2 10/23/2012 1400   CALCIUM 9.3 07/11/2011 1429   CALCIUM 9.1 11/16/2009 1315   ALKPHOS 72 10/23/2012 1400   ALKPHOS 73 07/11/2011 1429   ALKPHOS 69 11/16/2009 1315   AST 25 10/23/2012 1400   AST 24 07/11/2011 1429   AST 25 11/16/2009 1315   ALT 21 10/23/2012 1400   ALT 19 07/11/2011 1429   ALT 20 11/16/2009 1315   BILITOT 0.80 10/23/2012 1400   BILITOT 0.7 07/11/2011 1429   BILITOT 1.00 11/16/2009 1315       RADIOGRAPHIC STUDIES:  No results found.  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 She underwent a mastectomy followed by adjuvant chemotherapy consisting of 6 cycles of Taxotere Adriamycin and Cytoxan. She then received post mastectomy radiation therapy followed by reconstruction. She is now on letrozole 2.5 mg since January 2010. She does have itching of the left breast nipple she has had an extensive workup without any significant findings.   PLAN:   #1 she will continue the letrozole at this time.  #2 I will see her back in about 6 months time for followup.    All questions were answered. The patient knows to call the clinic with any problems, questions or concerns. We can certainly see the patient much sooner if necessary.  I spent 25 minutes counseling the patient face to face. The total time spent in the appointment was 30 minutes.    Drue Second, MD Medical/Oncology [facility] [phone]  (beeper) [phone] (Office)

## 2013-02-04 ENCOUNTER — Other Ambulatory Visit: Payer: Self-pay

## 2013-02-08 ENCOUNTER — Encounter: Payer: Self-pay | Admitting: Oncology

## 2013-02-10 ENCOUNTER — Telehealth: Payer: Self-pay | Admitting: *Deleted

## 2013-02-10 NOTE — Telephone Encounter (Signed)
Bruising not due to femara  If bruising continue she may need to go see her PCP

## 2013-02-10 NOTE — Telephone Encounter (Signed)
Unable to reach, LMOVM -Per MD, notified pt bruising is not due to femara. If the bruising continues pt may need to go see her PCP. Request call back with any concerns.

## 2013-02-10 NOTE — Telephone Encounter (Signed)
Called patient in regards to her non-urgent email question.  Patient states she bruises very easily.  The bruise she had in question was the size of two 50cent pieces.  She has no idea how it got there.  She had to see a vein specialist last week and there was no blood clot in her leg.  Her right arm is the mastectomy side (with lymph nodes removed).  The burning and rubber band feeling are gone at present. Her question is:  Is the bruising connected to the Femara? Does Dr. Welton Flakes want her to do anything different? (Let her know in the future, if she is having problems with her right arm, such as swelling, redness, tingling - she needs to call and discuss and not do non-urgent email. Discussed with her that she could be having sx of lympedema that would necessitate her being seen urgently.  She verbalized understanding).

## 2013-02-10 NOTE — Telephone Encounter (Signed)
Triage RN here with pt email/concerns. Triage RN spoke with pt today at length regarding bruising. This is noted in pt's chart. Pt would like to know if the bruising she has is related to her taking Femara and should pt continue with Femara or not. Pt last seen 10/27/12, next f/u 04/29/13  Message forward to MD for review.

## 2013-04-28 ENCOUNTER — Other Ambulatory Visit: Payer: Self-pay

## 2013-04-28 DIAGNOSIS — Z9889 Other specified postprocedural states: Secondary | ICD-10-CM

## 2013-04-28 DIAGNOSIS — Z1231 Encounter for screening mammogram for malignant neoplasm of breast: Secondary | ICD-10-CM

## 2013-04-29 ENCOUNTER — Ambulatory Visit (HOSPITAL_BASED_OUTPATIENT_CLINIC_OR_DEPARTMENT_OTHER): Payer: No Typology Code available for payment source | Admitting: Oncology

## 2013-04-29 ENCOUNTER — Telehealth: Payer: Self-pay | Admitting: Oncology

## 2013-04-29 ENCOUNTER — Other Ambulatory Visit (HOSPITAL_BASED_OUTPATIENT_CLINIC_OR_DEPARTMENT_OTHER): Payer: No Typology Code available for payment source

## 2013-04-29 ENCOUNTER — Ambulatory Visit: Payer: No Typology Code available for payment source

## 2013-04-29 ENCOUNTER — Encounter: Payer: Self-pay | Admitting: Oncology

## 2013-04-29 VITALS — BP 156/80 | HR 69 | Temp 98.3°F | Resp 18 | Wt 192.8 lb

## 2013-04-29 DIAGNOSIS — C50919 Malignant neoplasm of unspecified site of unspecified female breast: Secondary | ICD-10-CM

## 2013-04-29 DIAGNOSIS — C50911 Malignant neoplasm of unspecified site of right female breast: Secondary | ICD-10-CM

## 2013-04-29 DIAGNOSIS — L299 Pruritus, unspecified: Secondary | ICD-10-CM

## 2013-04-29 DIAGNOSIS — Z170 Estrogen receptor positive status [ER+]: Secondary | ICD-10-CM

## 2013-04-29 LAB — COMPREHENSIVE METABOLIC PANEL (CC13)
ALT: 19 U/L (ref 0–55)
AST: 21 U/L (ref 5–34)
Albumin: 4.1 g/dL (ref 3.5–5.0)
Alkaline Phosphatase: 77 U/L (ref 40–150)
Anion Gap: 11 mEq/L (ref 3–11)
BUN: 13.6 mg/dL (ref 7.0–26.0)
CO2: 28 mEq/L (ref 22–29)
Calcium: 9.6 mg/dL (ref 8.4–10.4)
Chloride: 102 mEq/L (ref 98–109)
Creatinine: 1.2 mg/dL — ABNORMAL HIGH (ref 0.6–1.1)
Glucose: 68 mg/dl — ABNORMAL LOW (ref 70–140)
Potassium: 3.6 mEq/L (ref 3.5–5.1)
Sodium: 141 mEq/L (ref 136–145)
Total Bilirubin: 0.86 mg/dL (ref 0.20–1.20)
Total Protein: 7.4 g/dL (ref 6.4–8.3)

## 2013-04-29 LAB — PROTHROMBIN TIME
INR: 0.88 (ref <1.50)
Prothrombin Time: 11.8 seconds (ref 11.6–15.2)

## 2013-04-29 LAB — APTT: aPTT: 23.2 seconds — ABNORMAL LOW (ref 24–37)

## 2013-04-29 LAB — CBC WITH DIFFERENTIAL/PLATELET
BASO%: 0.6 % (ref 0.0–2.0)
Basophils Absolute: 0 10*3/uL (ref 0.0–0.1)
EOS%: 2.7 % (ref 0.0–7.0)
Eosinophils Absolute: 0.1 10*3/uL (ref 0.0–0.5)
HCT: 42.4 % (ref 34.8–46.6)
HGB: 14.3 g/dL (ref 11.6–15.9)
LYMPH%: 26.7 % (ref 14.0–49.7)
MCH: 30.3 pg (ref 25.1–34.0)
MCHC: 33.7 g/dL (ref 31.5–36.0)
MCV: 89.9 fL (ref 79.5–101.0)
MONO#: 0.4 10*3/uL (ref 0.1–0.9)
MONO%: 8.2 % (ref 0.0–14.0)
NEUT#: 3.1 10*3/uL (ref 1.5–6.5)
NEUT%: 61.8 % (ref 38.4–76.8)
Platelets: 161 10*3/uL (ref 145–400)
RBC: 4.72 10*6/uL (ref 3.70–5.45)
RDW: 13.4 % (ref 11.2–14.5)
WBC: 5 10*3/uL (ref 3.9–10.3)
lymph#: 1.3 10*3/uL (ref 0.9–3.3)

## 2013-04-29 NOTE — Progress Notes (Signed)
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 Postmastectomy she received adjuvant chemotherapy consisting of Taxotere Adriamycin Cytoxan for a total of 6 cycles followed by radiation.  #2 she received tamoxifen for 2 years and then switch to letrozole 2.5 mg in January 2010.  #3 anxiety and depression patient is currently on Wellbutrin.  CURRENT THERAPY: Letrozole 2.5 mg daily  INTERVAL HISTORY: Ashley Mann 53 y.o. female returns for followup visit today. Her main complaint is hot flashes and depression. She otherwise has no nausea or vomiting no fevers chills no night sweats she does have hot flashes. She does have aches and pains. Remainder of the 10 point review of systems is negative.  MEDICAL HISTORY: Past Medical History  Diagnosis Date  . Genital herpes in women   . Depression   . Breast cancer   . History of unilateral modified radical mastectomy 11/21/04  . Hyperlipidemia   . Hypertension     ALLERGIES:  has No Known Allergies.  MEDICATIONS:  Current Outpatient Prescriptions  Medication Sig Dispense Refill  . acyclovir (ZOVIRAX) 200 MG capsule Take 200 mg by mouth daily.      Marland Kitchen amLODipine (NORVASC) 5 MG tablet Take 5 mg by mouth daily.      . Ascorbic Acid 1000 MG CHEW Chew 1 each by mouth.      Marland Kitchen buPROPion (WELLBUTRIN SR) 200 MG 12 hr tablet Take 1 tablet (200 mg total) by mouth 2 (two) times daily.  60 tablet  6  . cholecalciferol (VITAMIN D) 1000 UNITS tablet Take 1,000 Units by mouth daily.      Marland Kitchen estradiol (ESTRACE VAGINAL) 0.1 MG/GM vaginal cream Insert 1/4 an applicator  vaginally once weekly as needed.  42.5 g  12  . letrozole (FEMARA) 2.5 MG tablet Take 1 tablet (2.5 mg total) by mouth daily.  90 tablet  12  . Multiple Vitamin (MULTIVITAMIN) tablet Take 1 tablet by mouth daily.      . simvastatin (ZOCOR) 40 MG tablet Take 40 mg by mouth every evening.      . vitamin E 100 UNIT capsule Take 400 Units by mouth daily.       No current facility-administered medications for this visit.    SURGICAL HISTORY:  Past Surgical History  Procedure Laterality Date  . Modified radical mastectomy w/ axillary lymph node dissection  11/21/04    Right. With TRAM reconstruction.   . Abdominal hysterectomy    . Cholecystectomy    . Breast surgery      rt maste  . Reduction mammaplasty      reduction  . Breast reconstruction    . Oophorectomy    . Salpingoophorectomy    . Cesarean section      REVIEW OF SYSTEMS:  Pertinent items are noted in HPI.   HEALTH MAINTENANCE:  PHYSICAL EXAMINATION: Blood pressure 156/80, pulse 69, temperature 98.3 F (36.8 C), temperature source Oral, resp. rate 18, weight 192 lb 12.8 oz (87.454 kg). Body mass index is 30.19 kg/(m^2). ECOG PERFORMANCE STATUS: 1 - Symptomatic but completely ambulatory Well-developed well-nourished female HEENT exam EOMI PERRLA sclerae anicteric no conjunctival pallor oral mucosa  is moist neck is supple no palpable adenopathy lungs clear cardiovascular is regular rate rhythm abdomen is soft nontender no HSM extremities no edema neuro is nonfocal left breast no nipple discharge no skin changes no palpable lymph nodes or masses. Right reconstructed breast is well healed no masses.     LABORATORY DATA: Lab Results  Component Value Date   WBC 5.0 04/29/2013   HGB 14.3 04/29/2013   HCT 42.4 04/29/2013   MCV 89.9 04/29/2013   PLT 161 04/29/2013      Chemistry      Component Value Date/Time   NA 140 10/23/2012 1400   NA 140 07/11/2011 1429   NA 144 11/16/2009 1315   K 3.9 10/23/2012 1400   K 3.6 07/11/2011  1429   K 3.9 11/16/2009 1315   CL 103 01/16/2012 1146   CL 103 07/11/2011 1429   CL 102 11/16/2009 1315   CO2 26 10/23/2012 1400   CO2 29 07/11/2011 1429   CO2 29 11/16/2009 1315   BUN 15.9 10/23/2012 1400   BUN 16 07/11/2011 1429   BUN 14 11/16/2009 1315   CREATININE 1.2* 10/23/2012 1400   CREATININE 1.16* 07/11/2011 1429   CREATININE 0.9 11/16/2009 1315      Component Value Date/Time   CALCIUM 9.2 10/23/2012 1400   CALCIUM 9.3 07/11/2011 1429   CALCIUM 9.1 11/16/2009 1315   ALKPHOS 72 10/23/2012 1400   ALKPHOS 73 07/11/2011 1429   ALKPHOS 69 11/16/2009 1315   AST 25 10/23/2012 1400   AST 24 07/11/2011 1429   AST 25 11/16/2009 1315   ALT 21 10/23/2012 1400   ALT 19 07/11/2011 1429   ALT 20 11/16/2009 1315   BILITOT 0.80 10/23/2012 1400   BILITOT 0.7 07/11/2011 1429   BILITOT 1.00 11/16/2009 1315       RADIOGRAPHIC STUDIES:  No results found.  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 She underwent a mastectomy followed by adjuvant chemotherapy consisting of 6 cycles of Taxotere Adriamycin and Cytoxan. She then received post mastectomy radiation therapy followed by reconstruction. She is now on letrozole 2.5 mg since January 2010. Overall patient is tolerating letrozole well. She has no evidence of recurrent disease. She will continue this for 7-10 years.  #2She does have itching of the left breast nipple she has had an extensive workup without any significant findings.  #3 bone density: Last bone density was in August 2011. I will plan on getting her set up for a bone density scan. she is encouraged to take vitamin D and calcium  #4 patient will be seen back in 6 months time for followup   All questions were answered. The patient knows to call the clinic with any problems, questions or concerns. We can certainly see the patient much sooner if necessary.  I spent 20 minutes counseling the patient face  to face. The total time spent in the appointment was 30 minutes.    Marcy Panning, MD Medical/Oncology [facility] [phone] (beeper) [phone] (Office)

## 2013-05-04 LAB — VON WILLEBRAND FACTOR MULTIMER
Factor-VIII Activity: 234 % — ABNORMAL HIGH (ref 50–180)
Ristocetin Co-Factor: 249 % — ABNORMAL HIGH (ref 42–200)
Von Willebrand Factor Ag: 210 % (ref 50–217)

## 2013-05-05 ENCOUNTER — Telehealth: Payer: Self-pay | Admitting: *Deleted

## 2013-05-05 NOTE — Telephone Encounter (Signed)
Message copied by Amelia Jo I on Wed May 05, 2013  4:00 PM ------      Message from: Deatra Robinson      Created: Wed May 05, 2013  8:19 AM       Let patient know that the bleeding work up is normal ------

## 2013-05-05 NOTE — Telephone Encounter (Signed)
As noted below by Dr. Humphrey Rolls, I informed patient that her labs were normal. Patient verbalized understanding.

## 2013-06-10 ENCOUNTER — Ambulatory Visit: Payer: No Typology Code available for payment source

## 2013-06-22 ENCOUNTER — Other Ambulatory Visit: Payer: Self-pay | Admitting: Oncology

## 2013-06-24 ENCOUNTER — Other Ambulatory Visit: Payer: Self-pay

## 2013-06-24 ENCOUNTER — Ambulatory Visit
Admission: RE | Admit: 2013-06-24 | Discharge: 2013-06-24 | Disposition: A | Discharge status: Home / Self Care | Payer: Self-pay | Source: Ambulatory Visit

## 2013-06-24 ENCOUNTER — Ambulatory Visit
Admission: RE | Admit: 2013-06-24 | Discharge: 2013-06-24 | Disposition: A | Discharge status: Home / Self Care | Payer: No Typology Code available for payment source | Source: Ambulatory Visit

## 2013-06-24 DIAGNOSIS — Z1231 Encounter for screening mammogram for malignant neoplasm of breast: Secondary | ICD-10-CM

## 2013-06-24 DIAGNOSIS — Z9889 Other specified postprocedural states: Secondary | ICD-10-CM

## 2013-06-28 ENCOUNTER — Other Ambulatory Visit: Payer: Self-pay | Admitting: Oncology

## 2013-06-30 ENCOUNTER — Other Ambulatory Visit: Payer: Self-pay

## 2013-06-30 DIAGNOSIS — F32A Depression, unspecified: Secondary | ICD-10-CM

## 2013-06-30 DIAGNOSIS — F329 Major depressive disorder, single episode, unspecified: Secondary | ICD-10-CM

## 2013-06-30 MED ORDER — BUPROPION HCL ER (SR) 200 MG PO TB12
200.0000 mg | ORAL_TABLET | Freq: Two times a day (BID) | ORAL | Status: DC
Start: 2013-06-30 — End: 2013-10-07

## 2013-06-30 NOTE — Telephone Encounter (Signed)
Pt called re: refills for 6 month period.  Per Epic scrip refilled for 60 tablet R0 on 3/24 and 60 tablets R0 on 3/30.  Pt normally rcvs 60 tablets with 6 refills - covers her until next 6 months follow up with Dr. Humphrey Rolls.  Sent refill prescription for another 60 tablets with 3 refills.  Should provide a total of 6 month supply.  Pt notified and voiced understanding.

## 2013-08-17 ENCOUNTER — Telehealth: Payer: Self-pay | Admitting: Adult Health

## 2013-08-17 NOTE — Telephone Encounter (Signed)
, °

## 2013-09-02 ENCOUNTER — Ambulatory Visit: Payer: No Typology Code available for payment source | Admitting: Oncology

## 2013-09-02 ENCOUNTER — Other Ambulatory Visit: Payer: No Typology Code available for payment source

## 2013-10-06 ENCOUNTER — Other Ambulatory Visit: Payer: Self-pay | Admitting: *Deleted

## 2013-10-06 DIAGNOSIS — C50911 Malignant neoplasm of unspecified site of right female breast: Secondary | ICD-10-CM

## 2013-10-07 ENCOUNTER — Encounter: Payer: Self-pay | Admitting: Adult Health

## 2013-10-07 ENCOUNTER — Other Ambulatory Visit (HOSPITAL_BASED_OUTPATIENT_CLINIC_OR_DEPARTMENT_OTHER): Payer: No Typology Code available for payment source

## 2013-10-07 ENCOUNTER — Ambulatory Visit (HOSPITAL_BASED_OUTPATIENT_CLINIC_OR_DEPARTMENT_OTHER): Payer: No Typology Code available for payment source | Admitting: Adult Health

## 2013-10-07 VITALS — BP 151/91 | HR 87 | Temp 97.8°F | Resp 18 | Ht 67.0 in | Wt 192.1 lb

## 2013-10-07 DIAGNOSIS — F32A Depression, unspecified: Secondary | ICD-10-CM

## 2013-10-07 DIAGNOSIS — C50911 Malignant neoplasm of unspecified site of right female breast: Secondary | ICD-10-CM

## 2013-10-07 DIAGNOSIS — F329 Major depressive disorder, single episode, unspecified: Secondary | ICD-10-CM

## 2013-10-07 DIAGNOSIS — Z170 Estrogen receptor positive status [ER+]: Secondary | ICD-10-CM

## 2013-10-07 DIAGNOSIS — C50919 Malignant neoplasm of unspecified site of unspecified female breast: Secondary | ICD-10-CM

## 2013-10-07 LAB — CBC WITH DIFFERENTIAL/PLATELET
BASO%: 0.9 % (ref 0.0–2.0)
Basophils Absolute: 0 10*3/uL (ref 0.0–0.1)
EOS%: 2.1 % (ref 0.0–7.0)
Eosinophils Absolute: 0.1 10*3/uL (ref 0.0–0.5)
HCT: 42.4 % (ref 34.8–46.6)
HGB: 14 g/dL (ref 11.6–15.9)
LYMPH%: 27.9 % (ref 14.0–49.7)
MCH: 29.8 pg (ref 25.1–34.0)
MCHC: 33.1 g/dL (ref 31.5–36.0)
MCV: 90.1 fL (ref 79.5–101.0)
MONO#: 0.4 10*3/uL (ref 0.1–0.9)
MONO%: 8.3 % (ref 0.0–14.0)
NEUT#: 2.7 10*3/uL (ref 1.5–6.5)
NEUT%: 60.8 % (ref 38.4–76.8)
Platelets: 172 10*3/uL (ref 145–400)
RBC: 4.7 10*6/uL (ref 3.70–5.45)
RDW: 12.5 % (ref 11.2–14.5)
WBC: 4.4 10*3/uL (ref 3.9–10.3)
lymph#: 1.2 10*3/uL (ref 0.9–3.3)

## 2013-10-07 LAB — COMPREHENSIVE METABOLIC PANEL (CC13)
ALT: 24 U/L (ref 0–55)
AST: 24 U/L (ref 5–34)
Albumin: 4 g/dL (ref 3.5–5.0)
Alkaline Phosphatase: 72 U/L (ref 40–150)
Anion Gap: 11 mEq/L (ref 3–11)
BUN: 15 mg/dL (ref 7.0–26.0)
CO2: 26 mEq/L (ref 22–29)
Calcium: 9.4 mg/dL (ref 8.4–10.4)
Chloride: 103 mEq/L (ref 98–109)
Creatinine: 1.3 mg/dL — ABNORMAL HIGH (ref 0.6–1.1)
Glucose: 82 mg/dl (ref 70–140)
Potassium: 4 mEq/L (ref 3.5–5.1)
Sodium: 141 mEq/L (ref 136–145)
Total Bilirubin: 0.96 mg/dL (ref 0.20–1.20)
Total Protein: 7.2 g/dL (ref 6.4–8.3)

## 2013-10-07 MED ORDER — BUPROPION HCL ER (SR) 200 MG PO TB12: 200.0000 mg | ORAL_TABLET | Freq: Two times a day (BID) | ORAL | Status: DC

## 2013-10-07 MED ORDER — LETROZOLE 2.5 MG PO TABS
2.5000 mg | ORAL_TABLET | Freq: Every day | ORAL | Status: DC
Start: 2013-10-07 — End: 2014-12-19

## 2013-10-07 MED ORDER — LETROZOLE 2.5 MG PO TABS: 2.5000 mg | ORAL_TABLET | Freq: Every day | ORAL | Status: DC

## 2013-10-07 NOTE — Progress Notes (Signed)
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 Postmastectomy she received adjuvant chemotherapy consisting of Taxotere Adriamycin Cytoxan for a total of 6 cycles followed by radiation.  #2 she received tamoxifen for 2 years and then switch to letrozole 2.5 mg in January 2010.  #3 anxiety and depression patient is currently on Wellbutrin.  CURRENT THERAPY: Letrozole 2.5 mg daily  INTERVAL HISTORY: Ashley Mann 53 y.o. female returns for followup visit today. She is taking Letrozole daily.  She has been taking this for 5 years.  She took Tamoxifen for 2 years before that.  She does not have any joint aches, hot flashes.  She does endorse vaginal dryness that is manageable with estrace cream she uses sparingly that was prescribed by Dr. Humphrey Rolls.  Otherwise, she denies fevers, chills, new pain, headaches, dysuria, shortness of breath, or any further concerns.   MEDICAL HISTORY: Past Medical History  Diagnosis Date  . Genital herpes in women   . Depression   . Breast cancer   . History of unilateral modified radical mastectomy 11/21/04  . Hyperlipidemia   . Hypertension     ALLERGIES:  has No Known Allergies.  MEDICATIONS:  Current Outpatient Prescriptions  Medication Sig Dispense Refill  . acyclovir (ZOVIRAX) 200 MG capsule Take 200 mg by mouth daily.      Marland Kitchen amLODipine (NORVASC) 5 MG tablet Take 5 mg by mouth daily.      . Ascorbic Acid 1000 MG CHEW Chew 1 each by mouth.      Marland Kitchen buPROPion (WELLBUTRIN SR) 200 MG 12 hr tablet Take 1 tablet (200 mg total) by mouth 2 (two) times daily.  60 tablet  0  .  cholecalciferol (VITAMIN D) 1000 UNITS tablet Take 1,000 Units by mouth daily.      Marland Kitchen estradiol (ESTRACE VAGINAL) 0.1 MG/GM vaginal cream Insert 1/4 an applicator vaginally once weekly as needed.  42.5 g  12  . letrozole (FEMARA) 2.5 MG tablet Take 1 tablet (2.5 mg total) by mouth daily.  90 tablet  12  . Multiple Vitamin (MULTIVITAMIN) tablet Take 1 tablet by mouth daily.      . simvastatin (ZOCOR) 40 MG tablet Take 40 mg by mouth every evening.      . vitamin E 100 UNIT capsule Take 400 Units by mouth daily.       No current facility-administered medications for this visit.    SURGICAL HISTORY:  Past Surgical History  Procedure Laterality Date  . Modified radical mastectomy w/ axillary lymph node dissection  11/21/04    Right. With TRAM reconstruction.   . Abdominal hysterectomy    . Cholecystectomy    . Breast surgery      rt maste  . Reduction mammaplasty      reduction  . Breast reconstruction    . Oophorectomy    . Salpingoophorectomy    . Cesarean section      REVIEW OF SYSTEMS:  A 10 point review of systems was conducted and is otherwise negative except for what is noted above.    Health Maintenance  Mammogram: 05/2013 Colonoscopy: due in 2016 Bone Density Scan: 10/2009 Pap  Smear: 2014 Eye Exam: 3 years ago, due Vitamin D Level: 09/2012 Lipid Panel: 10/07/2013   PHYSICAL EXAMINATION: Blood pressure 151/91, pulse 87, temperature 97.8 F (36.6 C), temperature source Oral, resp. rate 18, height 5\' 7"  (1.702 m), weight 192 lb 1.6 oz (87.136 kg). Body mass index is 30.08 kg/(m^2). GENERAL: Patient is a well appearing female in no acute distress HEENT:  Sclerae anicteric.  Oropharynx clear and moist. No ulcerations or evidence of oropharyngeal candidiasis. Neck is supple.  NODES:  No cervical, supraclavicular, or axillary lymphadenopathy palpated.  BREAST EXAM:  S/p right mastectomy and reconstruction, no swelling mass or any changes, left breast without lumps or nodules or  masses, benign bilateral breast exam.  LUNGS:  Clear to auscultation bilaterally.  No wheezes or rhonchi. HEART:  Regular rate and rhythm. No murmur appreciated. ABDOMEN:  Soft, nontender.  Positive, normoactive bowel sounds. No organomegaly palpated. MSK:  No focal spinal tenderness to palpation. Full range of motion bilaterally in the upper extremities. EXTREMITIES:  No peripheral edema.   SKIN:  Clear with no obvious rashes or skin changes. No nail dyscrasia. NEURO:  Nonfocal. Well oriented.  Appropriate affect. ECOG PERFORMANCE STATUS: 1 - Symptomatic but completely ambulatory      LABORATORY DATA: Lab Results  Component Value Date   WBC 4.4 10/07/2013   HGB 14.0 10/07/2013   HCT 42.4 10/07/2013   MCV 90.1 10/07/2013   PLT 172 10/07/2013      Chemistry      Component Value Date/Time   NA 141 10/07/2013 1502   NA 140 07/11/2011 1429   NA 144 11/16/2009 1315   K 4.0 10/07/2013 1502   K 3.6 07/11/2011 1429   K 3.9 11/16/2009 1315   CL 103 01/16/2012 1146   CL 103 07/11/2011 1429   CL 102 11/16/2009 1315   CO2 26 10/07/2013 1502   CO2 29 07/11/2011 1429   CO2 29 11/16/2009 1315   BUN 15.0 10/07/2013 1502   BUN 16 07/11/2011 1429   BUN 14 11/16/2009 1315   CREATININE 1.3* 10/07/2013 1502   CREATININE 1.16* 07/11/2011 1429   CREATININE 0.9 11/16/2009 1315      Component Value Date/Time   CALCIUM 9.4 10/07/2013 1502   CALCIUM 9.3 07/11/2011 1429   CALCIUM 9.1 11/16/2009 1315   ALKPHOS 72 10/07/2013 1502   ALKPHOS 73 07/11/2011 1429   ALKPHOS 69 11/16/2009 1315   AST 24 10/07/2013 1502   AST 24 07/11/2011 1429   AST 25 11/16/2009 1315   ALT 24 10/07/2013 1502   ALT 19 07/11/2011 1429   ALT 20 11/16/2009 1315   BILITOT 0.96 10/07/2013 1502   BILITOT 0.7 07/11/2011 1429   BILITOT 1.00 11/16/2009 1315       RADIOGRAPHIC STUDIES:  No results found.  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 She underwent a  mastectomy followed by adjuvant chemotherapy consisting of 6 cycles of Taxotere Adriamycin and Cytoxan. She then received post mastectomy radiation therapy followed by reconstruction. She is now on letrozole 2.5 mg since January 2010. Overall patient is tolerating letrozole well. She has no evidence of recurrent disease.   I reviewed her health maintenance.  We discussed survivorship in detail. I recommended healthy diet, exercise, and monthly self breast exams.   The patient does need a bone density, we will order this.  She will return in 6 months for labs and follow up.  All questions were answered. The patient knows to call the clinic with any problems, questions or concerns. We can certainly see the patient much sooner if necessary.  I spent 25 minutes counseling the patient face to face. The total time spent in the appointment was 30 minutes.  Minette Headland, [place] [phone]

## 2013-10-07 NOTE — Patient Instructions (Signed)
You are doing well.  You have no sign of recurrence.  Continue Letrozole.  We will see you back in 6 months.  I recommend a healthy diet, exercise, and self breast exams.  Please call us if you have any questions or concerns.    Breast Self-Awareness Practicing breast self-awareness may pick up problems early, prevent significant medical complications, and possibly save your life. By practicing breast self-awareness, you can become familiar with how your breasts look and feel and if your breasts are changing. This allows you to notice changes early. It can also offer you some reassurance that your breast health is good. One way to learn what is normal for your breasts and whether your breasts are changing is to do a breast self-exam. If you find a lump or something that was not present in the past, it is best to contact your caregiver right away. Other findings that should be evaluated by your caregiver include nipple discharge, especially if it is bloody; skin changes or reddening; areas where the skin seems to be pulled in (retracted); or new lumps and bumps. Breast pain is seldom associated with cancer (malignancy), but should also be evaluated by a caregiver. HOW TO PERFORM A BREAST SELF-EXAM The best time to examine your breasts is 5-7 days after your menstrual period is over. During menstruation, the breasts are lumpier, and it may be more difficult to pick up changes. If you do not menstruate, have reached menopause, or had your uterus removed (hysterectomy), you should examine your breasts at regular intervals, such as monthly. If you are breastfeeding, examine your breasts after a feeding or after using a breast pump. Breast implants do not decrease the risk for lumps or tumors, so continue to perform breast self-exams as recommended. Talk to your caregiver about how to determine the difference between the implant and breast tissue. Also, talk about the amount of pressure you should use during the  exam. Over time, you will become more familiar with the variations of your breasts and more comfortable with the exam. A breast self-exam requires you to remove all your clothes above the waist. 1. Look at your breasts and nipples. Stand in front of a mirror in a room with good lighting. With your hands on your hips, push your hands firmly downward. Look for a difference in shape, contour, and size from one breast to the other (asymmetry). Asymmetry includes puckers, dips, or bumps. Also, look for skin changes, such as reddened or scaly areas on the breasts. Look for nipple changes, such as discharge, dimpling, repositioning, or redness. 2. Carefully feel your breasts. This is best done either in the shower or tub while using soapy water or when flat on your back. Place the arm (on the side of the breast you are examining) above your head. Use the pads (not the fingertips) of your three middle fingers on your opposite hand to feel your breasts. Start in the underarm area and use  inch (2 cm) overlapping circles to feel your breast. Use 3 different levels of pressure (light, medium, and firm pressure) at each circle before moving to the next circle. The light pressure is needed to feel the tissue closest to the skin. The medium pressure will help to feel breast tissue a little deeper, while the firm pressure is needed to feel the tissue close to the ribs. Continue the overlapping circles, moving downward over the breast until you feel your ribs below your breast. Then, move one finger-width towards the  center of the body. Continue to use the  inch (2 cm) overlapping circles to feel your breast as you move slowly up toward the collar bone (clavicle) near the base of the neck. Continue the up and down exam using all 3 pressures until you reach the middle of the chest. Do this with each breast, carefully feeling for lumps or changes. 3.  Keep a written record with breast changes or normal findings for each breast.  By writing this information down, you do not need to depend only on memory for size, tenderness, or location. Write down where you are in your menstrual cycle, if you are still menstruating. Breast tissue can have some lumps or thick tissue. However, see your caregiver if you find anything that concerns you.  SEEK MEDICAL CARE IF:  You see a change in shape, contour, or size of your breasts or nipples.   You see skin changes, such as reddened or scaly areas on the breasts or nipples.   You have an unusual discharge from your nipples.   You feel a new lump or unusually thick areas.  Document Released: 03/18/2005 Document Revised: 03/04/2012 Document Reviewed: 07/03/2011 [facility] Patient Information 2015 [place], [place]. This information is not intended to replace advice given to you by your health care provider. Make sure you discuss any questions you have with your health care provider.

## 2014-04-18 ENCOUNTER — Telehealth: Payer: Self-pay | Admitting: Adult Health

## 2014-04-18 NOTE — Telephone Encounter (Signed)
Return vm to sch appt sch for 05/09/14 51month follow up.

## 2014-05-06 ENCOUNTER — Other Ambulatory Visit: Payer: Self-pay | Admitting: *Deleted

## 2014-05-06 DIAGNOSIS — C50919 Malignant neoplasm of unspecified site of unspecified female breast: Secondary | ICD-10-CM

## 2014-05-09 ENCOUNTER — Ambulatory Visit (HOSPITAL_BASED_OUTPATIENT_CLINIC_OR_DEPARTMENT_OTHER): Payer: No Typology Code available for payment source | Admitting: Hematology and Oncology

## 2014-05-09 ENCOUNTER — Telehealth: Payer: Self-pay | Admitting: Hematology and Oncology

## 2014-05-09 ENCOUNTER — Other Ambulatory Visit (HOSPITAL_BASED_OUTPATIENT_CLINIC_OR_DEPARTMENT_OTHER): Payer: No Typology Code available for payment source

## 2014-05-09 VITALS — BP 165/77 | HR 81 | Temp 98.3°F | Resp 18 | Ht 67.0 in | Wt 206.1 lb

## 2014-05-09 DIAGNOSIS — C50919 Malignant neoplasm of unspecified site of unspecified female breast: Secondary | ICD-10-CM

## 2014-05-09 DIAGNOSIS — Z853 Personal history of malignant neoplasm of breast: Secondary | ICD-10-CM

## 2014-05-09 DIAGNOSIS — C50911 Malignant neoplasm of unspecified site of right female breast: Secondary | ICD-10-CM

## 2014-05-09 LAB — CBC WITH DIFFERENTIAL/PLATELET
BASO%: 0.6 % (ref 0.0–2.0)
Basophils Absolute: 0 10*3/uL (ref 0.0–0.1)
EOS%: 2.4 % (ref 0.0–7.0)
Eosinophils Absolute: 0.1 10*3/uL (ref 0.0–0.5)
HCT: 41.7 % (ref 34.8–46.6)
HGB: 13.5 g/dL (ref 11.6–15.9)
LYMPH%: 22.6 % (ref 14.0–49.7)
MCH: 29.1 pg (ref 25.1–34.0)
MCHC: 32.3 g/dL (ref 31.5–36.0)
MCV: 90.3 fL (ref 79.5–101.0)
MONO#: 0.4 10*3/uL (ref 0.1–0.9)
MONO%: 7 % (ref 0.0–14.0)
NEUT#: 3.7 10*3/uL (ref 1.5–6.5)
NEUT%: 67.4 % (ref 38.4–76.8)
Platelets: 190 10*3/uL (ref 145–400)
RBC: 4.62 10*6/uL (ref 3.70–5.45)
RDW: 13.2 % (ref 11.2–14.5)
WBC: 5.5 10*3/uL (ref 3.9–10.3)
lymph#: 1.2 10*3/uL (ref 0.9–3.3)

## 2014-05-09 LAB — COMPREHENSIVE METABOLIC PANEL (CC13)
ALT: 21 U/L (ref 0–55)
AST: 20 U/L (ref 5–34)
Albumin: 3.8 g/dL (ref 3.5–5.0)
Alkaline Phosphatase: 76 U/L (ref 40–150)
Anion Gap: 10 mEq/L (ref 3–11)
BUN: 14.4 mg/dL (ref 7.0–26.0)
CO2: 27 mEq/L (ref 22–29)
Calcium: 8.8 mg/dL (ref 8.4–10.4)
Chloride: 106 mEq/L (ref 98–109)
Creatinine: 1.2 mg/dL — ABNORMAL HIGH (ref 0.6–1.1)
EGFR: 52 mL/min/{1.73_m2} — ABNORMAL LOW (ref >90)
Glucose: 94 mg/dl (ref 70–140)
Potassium: 3.9 mEq/L (ref 3.5–5.1)
Sodium: 143 mEq/L (ref 136–145)
Total Bilirubin: 0.51 mg/dL (ref 0.20–1.20)
Total Protein: 6.8 g/dL (ref 6.4–8.3)

## 2014-05-09 NOTE — Telephone Encounter (Signed)
gv and printed appt sched and avs fo rpt for Aug and Feb 2017

## 2014-05-09 NOTE — Progress Notes (Signed)
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 Postmastectomy she received adjuvant chemotherapy consisting of Taxotere Adriamycin Cytoxan for a total of 6 cycles followed by radiation.  #2 she received tamoxifen for 2 years and then switch to letrozole 2.5 mg in January 2010.  #3 anxiety and depression patient is currently on Wellbutrin.  CURRENT THERAPY: Letrozole 2.5 mg daily to be completed December 2016 CHIEF COMPLIANT: Follow-up of breast cancer  INTERVAL HISTORY: Ashley Mann is a 54 year old lady with above-mentioned history of H2 breast cancer treated with mastectomy followed by adjuvant chemotherapy and radiation followed by antiestrogen therapy with tamoxifen initially then switched to letrozole. She has been on letrozole or antiestrogen therapy for the past 9 years. She's been tolerating it fairly well with occasional hot flashes. Denies any lumps or nodules in the breasts. She does feel emotional at times and has tears coming down her eyes.  REVIEW OF SYSTEMS:   Constitutional: Denies fevers, chills or abnormal weight loss Eyes: Denies blurriness of vision Ears, nose, mouth, throat, and face: Denies mucositis or sore throat Respiratory: Denies cough, dyspnea or wheezes Cardiovascular: Denies palpitation, chest discomfort or lower extremity swelling Gastrointestinal:  Denies nausea, heartburn or change in bowel habits Skin: Denies abnormal skin rashes Lymphatics: Denies new lymphadenopathy or easy bruising Neurological:Denies numbness, tingling or new weaknesses Behavioral/Psych: Depression symptoms  Breast:  denies any pain or lumps or  nodules in either breasts All other systems were reviewed with the patient and are negative.  I have reviewed the past medical history, past surgical history, social history and family history with the patient and they are unchanged from previous note.  ALLERGIES:  has No Known Allergies.  MEDICATIONS:  Current Outpatient Prescriptions  Medication Sig Dispense Refill  . acyclovir (ZOVIRAX) 200 MG capsule Take 200 mg by mouth daily.    Marland Kitchen amLODipine (NORVASC) 5 MG tablet Take 5 mg by mouth daily.    . Ascorbic Acid 1000 MG CHEW Chew 1 each by mouth.    Marland Kitchen buPROPion (WELLBUTRIN SR) 200 MG 12 hr tablet Take 1 tablet (200 mg total) by mouth 2 (two) times daily. 60 tablet 0  . cholecalciferol (VITAMIN D) 1000 UNITS tablet Take 1,000 Units by mouth daily.    Marland Kitchen estradiol (ESTRACE VAGINAL) 0.1 MG/GM vaginal cream Insert 1/4 an applicator vaginally once weekly as needed. 42.5 g 12  . letrozole (FEMARA) 2.5 MG tablet Take 1 tablet (2.5 mg total) by mouth daily. 90 tablet 12  . Multiple Vitamin (MULTIVITAMIN) tablet Take 1 tablet by mouth daily.    . simvastatin (ZOCOR) 40 MG tablet Take 40 mg by mouth every evening.    . vitamin E 100 UNIT capsule Take 400 Units by mouth daily.     No current facility-administered medications for this visit.    PHYSICAL EXAMINATION: ECOG PERFORMANCE STATUS: 1 - Symptomatic but completely ambulatory  Filed Vitals:   05/09/14 1530  BP: 165/77  Pulse: 81  Temp: 98.3 F (36.8 C)  Resp: 18   Filed Weights   05/09/14 1530  Weight: 206 lb 1.6 oz (93.486 kg)    GENERAL:alert, no distress and comfortable SKIN: skin color, texture, turgor are normal, no  rashes or significant lesions EYES: normal, Conjunctiva are pink and non-injected, sclera clear OROPHARYNX:no exudate, no erythema and lips, buccal mucosa, and tongue normal  NECK: supple, thyroid normal size, non-tender, without nodularity LYMPH:  no palpable lymphadenopathy in the cervical, axillary or  inguinal LUNGS: clear to auscultation and percussion with normal breathing effort HEART: regular rate & rhythm and no murmurs and no lower extremity edema ABDOMEN:abdomen soft, non-tender and normal bowel sounds Musculoskeletal:no cyanosis of digits and no clubbing  NEURO: alert & oriented x 3 with fluent speech, no focal motor/sensory deficits BREAST: No palpable masses or nodules in either right or left breasts. Right breast reconstructed appears to be fine without any palpable abnormalities. No palpable axillary supraclavicular or infraclavicular adenopathy no breast tenderness or nipple discharge. (exam performed in the presence of a chaperone)  LABORATORY DATA:  I have reviewed the data as listed   Chemistry      Component Value Date/Time   NA 143 05/09/2014 1505   NA 140 07/11/2011 1429   NA 144 11/16/2009 1315   K 3.9 05/09/2014 1505   K 3.6 07/11/2011 1429   K 3.9 11/16/2009 1315   CL 103 01/16/2012 1146   CL 103 07/11/2011 1429   CL 102 11/16/2009 1315   CO2 27 05/09/2014 1505   CO2 29 07/11/2011 1429   CO2 29 11/16/2009 1315   BUN 14.4 05/09/2014 1505   BUN 16 07/11/2011 1429   BUN 14 11/16/2009 1315   CREATININE 1.2* 05/09/2014 1505   CREATININE 1.16* 07/11/2011 1429   CREATININE 0.9 11/16/2009 1315      Component Value Date/Time   CALCIUM 8.8 05/09/2014 1505   CALCIUM 9.3 07/11/2011 1429   CALCIUM 9.1 11/16/2009 1315   ALKPHOS 76 05/09/2014 1505   ALKPHOS 73 07/11/2011 1429   ALKPHOS 69 11/16/2009 1315   AST 20 05/09/2014 1505   AST 24 07/11/2011 1429   AST 25 11/16/2009 1315   ALT 21 05/09/2014 1505   ALT 19 07/11/2011 1429   ALT 20 11/16/2009 1315   BILITOT 0.51 05/09/2014 1505   BILITOT 0.7 07/11/2011 1429   BILITOT 1.00 11/16/2009 1315       Lab Results  Component Value Date   WBC 5.5 05/09/2014   HGB 13.5 05/09/2014   HCT 41.7 05/09/2014   MCV 90.3 05/09/2014   PLT 190 05/09/2014   NEUTROABS 3.7 05/09/2014     RADIOGRAPHIC STUDIES: I  have personally reviewed the radiology reports and agreed with their findings. 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 Mammogram done March 2015 is normal 2. Breast exam 05/09/2014 is normal  Survivorship:Discussed the importance of physical exercise in decreasing the likelihood of breast cancer recurrence. Recommended 30 mins daily 6 days a week of either brisk walking or cycling or swimming. Encouraged patient to eat more fruits and vegetables and decrease red meat. Patient would like [place] to obtain cholesterol panel with her next blood work in 6 months for her family physician. I will go ahead and enter those orders today.  Return to clinic in 1 year for follow-up    Orders  Placed This Encounter  Procedures  . CBC with Differential    Standing Status: Future     Number of Occurrences:      Standing Expiration Date: 05/09/2015  . Comprehensive metabolic panel (Cmet) - [facility]    Standing Status: Future     Number of Occurrences:      Standing Expiration Date: 05/09/2015  . Lipid panel    Standing Status: Future     Number of Occurrences:      Standing Expiration Date: 05/09/2015  . TSH    Standing Status: Future     Number of Occurrences:      Standing Expiration Date: 06/13/2015  . CBC with Differential    Standing Status: Future     Number of Occurrences:      Standing Expiration Date: 05/09/2015  . Comprehensive metabolic panel (Cmet) - [facility]    Standing Status: Future     Number of Occurrences:      Standing Expiration Date: 05/09/2015   The  patient has a good understanding of the overall plan. she agrees with it. She will call with any problems that may develop before her next visit here.   Rulon Eisenmenger, MD

## 2014-05-09 NOTE — Assessment & Plan Note (Addendum)
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 Mammogram done March 2015 is normal 2. Breast exam 05/09/2014 is normal  Survivorship:Discussed the importance of physical exercise in decreasing the likelihood of breast cancer recurrence. Recommended 30 mins daily 6 days a week of either brisk walking or cycling or swimming. Encouraged patient to eat more fruits and vegetables and decrease red meat. Patient would like [place] to obtain cholesterol panel with her next blood work in 6 months for her family physician. I will go ahead and enter those orders today.  Return to clinic in 1 year for follow-up

## 2014-05-19 LAB — LIPID PANEL
Cholesterol: 142 mg/dL (ref 0–200)
HDL: 43 mg/dL (ref 35–70)
LDL Cholesterol: 44 mg/dL
Triglycerides: 276 mg/dL — AB (ref 40–160)

## 2014-06-02 ENCOUNTER — Other Ambulatory Visit: Payer: Self-pay

## 2014-06-02 DIAGNOSIS — Z1231 Encounter for screening mammogram for malignant neoplasm of breast: Secondary | ICD-10-CM

## 2014-06-28 ENCOUNTER — Ambulatory Visit: Payer: No Typology Code available for payment source

## 2014-07-06 ENCOUNTER — Other Ambulatory Visit: Payer: Self-pay

## 2014-07-06 ENCOUNTER — Ambulatory Visit
Admission: RE | Admit: 2014-07-06 | Discharge: 2014-07-06 | Disposition: A | Discharge status: Home / Self Care | Payer: No Typology Code available for payment source | Source: Ambulatory Visit

## 2014-07-06 DIAGNOSIS — Z1231 Encounter for screening mammogram for malignant neoplasm of breast: Secondary | ICD-10-CM

## 2014-07-25 DIAGNOSIS — B349 Viral infection, unspecified: Secondary | ICD-10-CM | POA: Insufficient documentation

## 2014-07-25 DIAGNOSIS — M01X0 Direct infection of unspecified joint in infectious and parasitic diseases classified elsewhere: Secondary | ICD-10-CM

## 2014-07-25 DIAGNOSIS — F329 Major depressive disorder, single episode, unspecified: Secondary | ICD-10-CM | POA: Insufficient documentation

## 2014-07-25 DIAGNOSIS — Z0000 Encounter for general adult medical examination without abnormal findings: Secondary | ICD-10-CM | POA: Insufficient documentation

## 2014-07-25 DIAGNOSIS — E7849 Other hyperlipidemia: Secondary | ICD-10-CM | POA: Insufficient documentation

## 2014-07-25 DIAGNOSIS — I10 Essential (primary) hypertension: Secondary | ICD-10-CM | POA: Insufficient documentation

## 2014-07-25 DIAGNOSIS — Z859 Personal history of malignant neoplasm, unspecified: Secondary | ICD-10-CM | POA: Insufficient documentation

## 2014-07-25 DIAGNOSIS — E669 Obesity, unspecified: Secondary | ICD-10-CM | POA: Insufficient documentation

## 2014-11-07 ENCOUNTER — Telehealth: Payer: Self-pay | Admitting: Hematology and Oncology

## 2014-11-07 ENCOUNTER — Other Ambulatory Visit: Payer: No Typology Code available for payment source

## 2014-11-07 NOTE — Telephone Encounter (Signed)
Called and left a message with a new appointment as she had called and left a message that she was sick and request to do lab next week

## 2014-11-08 ENCOUNTER — Ambulatory Visit: Payer: Self-pay | Admitting: Family Medicine

## 2014-11-14 ENCOUNTER — Other Ambulatory Visit: Payer: No Typology Code available for payment source

## 2014-11-15 ENCOUNTER — Ambulatory Visit: Payer: Self-pay | Admitting: Family Medicine

## 2014-11-21 ENCOUNTER — Ambulatory Visit
Admission: EM | Admit: 2014-11-21 | Discharge: 2014-11-21 | Disposition: A | Discharge status: Home / Self Care | Payer: No Typology Code available for payment source | Attending: Family Medicine | Admitting: Family Medicine

## 2014-11-21 ENCOUNTER — Encounter: Payer: Self-pay | Admitting: *Deleted

## 2014-11-21 DIAGNOSIS — M6248 Contracture of muscle, other site: Secondary | ICD-10-CM | POA: Diagnosis not present

## 2014-11-21 DIAGNOSIS — R059 Cough, unspecified: Secondary | ICD-10-CM

## 2014-11-21 DIAGNOSIS — M62838 Other muscle spasm: Secondary | ICD-10-CM

## 2014-11-21 DIAGNOSIS — R05 Cough: Secondary | ICD-10-CM

## 2014-11-21 MED ORDER — HYDROCODONE-ACETAMINOPHEN 5-325 MG PO TABS: 1.0000 | ORAL_TABLET | Freq: Four times a day (QID) | ORAL | Status: DC | PRN

## 2014-11-21 MED ORDER — AZITHROMYCIN 250 MG PO TABS: 250.0000 mg | ORAL_TABLET | Freq: Every day | ORAL | Status: DC

## 2014-11-21 MED ORDER — METHOCARBAMOL 750 MG PO TABS: 750.0000 mg | ORAL_TABLET | Freq: Three times a day (TID) | ORAL | Status: DC | PRN

## 2014-11-21 MED ORDER — IBUPROFEN 600 MG PO TABS: 600.0000 mg | ORAL_TABLET | Freq: Three times a day (TID) | ORAL | Status: DC | PRN

## 2014-11-21 MED ORDER — FLUTICASONE PROPIONATE 50 MCG/ACT NA SUSP: 2.0000 | Freq: Every day | NASAL | Status: DC

## 2014-11-21 MED ORDER — BENZONATATE 100 MG PO CAPS: 100.0000 mg | ORAL_CAPSULE | Freq: Three times a day (TID) | ORAL | Status: DC | PRN

## 2014-11-21 NOTE — ED Notes (Signed)
Pt states she is having neck pain, headache and pain in both ears. Started 7 days ago

## 2014-11-21 NOTE — Discharge Instructions (Signed)
Robaxin 750 mg  1 tablet at bedtime .Marland Kitchen.for a day or two you can repeat during the day but do not drive or operate machinery Vicodin -narcotic pain medicine  1 tablet may be taken at bedtime with muscle relaxer , it may also be repeated in the daytime but only for increased pain and no driving or operating machinery Ice paks to your neck  ( frozen peas from [employer] )  :)  Seasonal allergies-- full ears and sinuses,itchy ears and /or eyes, post nasal drip with throat clearing Try Zyrtec ( ceterizine) 1 tablet daily  Add Flonase nasal spray 1 spray per nostril twice daily until clear , then daily  Torticollis, Acute You have suddenly (acutely) developed a twisted neck (torticollis). This is usually a self-limited condition. CAUSES  Acute torticollis may be caused by malposition, trauma or infection. Most commonly, acute torticollis is caused by sleeping in an awkward position. Torticollis may also be caused by the flexion, extension or twisting of the neck muscles beyond their normal position. Sometimes, the exact cause may not be known. SYMPTOMS  Usually, there is pain and limited movement of the neck. Your neck may twist to one side. DIAGNOSIS  The diagnosis is often made by physical examination. X-rays, CT scans or MRIs may be done if there is a history of trauma or concern of infection. TREATMENT  For a common, stiff neck that develops during sleep, treatment is focused on relaxing the contracted neck muscle. Medications (including shots) may be used to treat the problem. Most cases resolve in several days. Torticollis usually responds to conservative physical therapy. If left untreated, the shortened and spastic neck muscle can cause deformities in the face and neck. Rarely, surgery is required. HOME CARE INSTRUCTIONS  Use over-the-counter and prescription medications as directed by your caregiver. Do stretching exercises and massage the neck as directed by your caregiver. Follow up  with physical therapy if needed and as directed by your caregiver. SEEK IMMEDIATE MEDICAL CARE IF:  You develop difficulty breathing or noisy breathing (stridor). You drool, develop trouble swallowing or have pain with swallowing. You develop numbness or weakness in the hands or feet. You have changes in speech or vision. You have problems with urination or bowel movements. You have difficulty walking. You have a fever. You have increased pain. MAKE SURE YOU:  Understand these instructions. Will watch your condition. Will get help right away if you are not doing well or get worse. Document Released: 03/15/2000 Document Revised: 06/10/2011 Document Reviewed: 04/26/2009 [facility] Patient Information 2015 [place], [place]. This information is not intended to replace advice given to you by your health care provider. Make sure you discuss any questions you have with your health care provider.  Barotitis Media Barotitis media is inflammation of your middle ear. This occurs when the auditory tube (eustachian tube) leading from the back of your nose (nasopharynx) to your eardrum is blocked. This blockage may result from a cold, environmental allergies, or an upper respiratory infection. Unresolved barotitis media may lead to damage or hearing loss (barotrauma), which may become permanent. HOME CARE INSTRUCTIONS   Use medicines as recommended by your health care provider. Over-the-counter medicines will help unblock the canal and can help during times of air travel.  Do not put anything into your ears to clean or unplug them. Eardrops will not be helpful.  Do not swim, dive, or fly until your health care provider says it is all right to do so. If these activities are necessary,  chewing gum with frequent, forceful swallowing may help. It is also helpful to hold your nose and gently blow to pop your ears for equalizing pressure changes. This forces air into the eustachian tube.  Only take  over-the-counter or prescription medicines for pain, discomfort, or fever as directed by your health care provider.  A decongestant may be helpful in decongesting the middle ear and make pressure equalization easier. SEEK MEDICAL CARE IF:  You experience a serious form of dizziness in which you feel as if the room is spinning and you feel nauseated (vertigo).  Your symptoms only involve one ear. SEEK IMMEDIATE MEDICAL CARE IF:   You develop a severe headache, dizziness, or severe ear pain.  You have bloody or pus-like drainage from your ears.  You develop a fever.  Your problems do not improve or become worse. MAKE SURE YOU:   Understand these instructions.  Will watch your condition.  Will get help right away if you are not doing well or get worse. Document Released: 03/15/2000 Document Revised: 01/06/2013 Document Reviewed: 10/13/2012 [facility] Patient Information 2015 [place], [place]. This information is not intended to replace advice given to you by your health care provider. Make sure you discuss any questions you have with your health care provider.

## 2014-11-23 ENCOUNTER — Other Ambulatory Visit: Payer: Self-pay | Admitting: Family Medicine

## 2014-11-23 DIAGNOSIS — B001 Herpesviral vesicular dermatitis: Secondary | ICD-10-CM

## 2014-11-29 ENCOUNTER — Encounter: Payer: Self-pay | Admitting: Physician Assistant

## 2014-11-29 NOTE — ED Provider Notes (Signed)
CSN: [id number]     Arrival date & time 11/21/14  1853 History   First MD Initiated Contact with Patient 11/21/14 1944     Chief Complaint  Patient presents with  . Neck Pain   (Consider location/radiation/quality/duration/timing/severity/associated sxs/prior Treatment) HPI 54 yo F presents with her husband for neck pain with activity, bilateral ear pain for past week and a non-productive cough. No trauma.No fever.  Positional discomfort.  Ears feel underwater-pressure-wants to stick her fingers in them but it doesn't do anything. Feels really bad, not coping with this plus sore neck Cough more recent- Has post nasal drip-cough increased in afternoon. Nasal congestion, clear discharge, occassionally itch eyes. S/p mastectomy- Right mod.rad. with TRAM flap recon;   Past Medical History  Diagnosis Date  . Genital herpes in women   . Depression   . Breast cancer   . History of unilateral modified radical mastectomy 11/21/04  . Hyperlipidemia   . Hypertension    Past Surgical History  Procedure Laterality Date  . Modified radical mastectomy w/ axillary lymph node dissection  11/21/04    Right. With TRAM reconstruction.   . Abdominal hysterectomy    . Cholecystectomy    . Breast surgery      rt maste  . Reduction mammaplasty      reduction  . Breast reconstruction    . Oophorectomy    . Salpingoophorectomy    . Cesarean section     Family History  Problem Relation Age of Onset  . Cancer Father     colon  . Cancer Paternal Aunt     lung  . Cancer Maternal Grandmother     ovarian   Social History  Substance Use Topics  . Smoking status: Former Smoker -- 1.00 packs/day for 25 years  . Smokeless tobacco: Never Used  . Alcohol Use: No   OB History    No data available     Review of Systems   Constitutional: No fever.  Eyes: No visual changes \\Ears - feel full,  ENT:No sore throat , positive post nasal drip, evening cough non-productive; Cardiovascular:Negative for  chest pain/palpitations Respiratory: Negative for shortness of breath Gastrointestinal: No abdominal pain. No nausea,vomiting, diarrhea Genitourinary: Negative for dysuria. Normal urination. Musculoskeletal: Negative for back pain. FROM extremities without pain Skin: Negative for rash Neurological: Negative for headache, focal weakness or numbness  Allergies  Review of patient's allergies indicates no known allergies.  Home Medications   Prior to Admission medications   Medication Sig Start Date End Date Taking? Authorizing Provider  acyclovir (ZOVIRAX) 200 MG capsule Take 200 mg by mouth daily.   Yes Historical Provider, MD  acyclovir (ZOVIRAX) 200 MG capsule Take 1 capsule by mouth daily. 05/19/14  Yes Historical Provider, MD  amLODipine (NORVASC) 5 MG tablet Take 5 mg by mouth daily.   Yes Historical Provider, MD  amLODipine (NORVASC) 5 MG tablet Take 1 tablet by mouth daily. 05/31/14  Yes Historical Provider, MD  Ascorbic Acid 1000 MG CHEW Chew 1 each by mouth.   Yes Historical Provider, MD  buPROPion (WELLBUTRIN SR) 200 MG 12 hr tablet Take 1 tablet (200 mg total) by mouth 2 (two) times daily. 10/07/13  Yes Minette Headland, NP  buPROPion (WELLBUTRIN SR) 200 MG 12 hr tablet Take 1 tablet by mouth 2 (two) times daily. 05/19/14  Yes Historical Provider, MD  cholecalciferol (VITAMIN D) 1000 UNITS tablet Take 1,000 Units by mouth daily.   Yes Historical Provider, MD  estradiol (ESTRACE VAGINAL) 0.1  MG/GM vaginal cream Insert 1/4 an applicator vaginally once weekly as needed. 10/27/12  Yes Consuela Mimes, MD  letrozole [facility]) 2.5 MG tablet Take 1 tablet (2.5 mg total) by mouth daily. 10/07/13  Yes Minette Headland, NP  letrozole (FEMARA) 2.5 MG tablet Take 1 tablet by mouth daily. Oncology   Yes Historical Provider, MD  meclizine (ANTIVERT) 25 MG tablet Take 1 tablet by mouth every 8 (eight) hours as needed. 06/14/14  Yes Historical Provider, MD  Multiple Vitamin (MULTIVITAMIN) tablet Take 1  tablet by mouth daily.   Yes Historical Provider, MD  simvastatin (ZOCOR) 40 MG tablet Take 40 mg by mouth every evening.   Yes Historical Provider, MD  simvastatin (ZOCOR) 40 MG tablet Take 1 tablet by mouth daily. 05/31/14  Yes Historical Provider, MD  vitamin E 100 UNIT capsule Take 400 Units by mouth daily.   Yes Historical Provider, MD  acyclovir (ZOVIRAX) 200 MG capsule TAKE ONE CAPSULE BY MOUTH ONCE DAILY 11/24/14   Juline Patch, MD  azithromycin (ZITHROMAX) 250 MG tablet Take 1 tablet (250 mg total) by mouth daily. Take first 2 tablets together, then 1 every day until finished. 11/21/14   Jan Fireman, PA-C  benzonatate (TESSALON) 100 MG capsule Take 1 capsule (100 mg total) by mouth 3 (three) times daily as needed. 11/21/14   Jan Fireman, PA-C  fluticasone [facility]) 50 MCG/ACT nasal spray Place 2 sprays into both nostrils daily. 11/21/14   Jan Fireman, PA-C  ibuprofen (ADVIL,MOTRIN) 600 MG tablet Take 1 tablet (600 mg total) by mouth every 8 (eight) hours as needed for moderate pain. 11/21/14   Jan Fireman, PA-C  methocarbamol (ROBAXIN-750) 750 MG tablet Take 1 tablet (750 mg total) by mouth every 8 (eight) hours as needed for muscle spasms (Do not drive or operate machinery). 11/21/14   Jan Fireman, PA-C   Meds Ordered and Administered this Visit  Medications - No data to display  BP 156/85 mmHg  Pulse 88  Temp(Src) 98.1 F (36.7 C) (Oral)  Ht 5\' 7"  (1.702 m)  Wt 200 lb (90.719 kg)  BMI 31.32 kg/m2  SpO2 100% No data found.   Physical Exam    Constitutional -alert and oriented,well appearing and in mild distress with ROM head/neck. Good flexibility chin/chest , but rotation with neck stiffness and muscle pain. Head-atraumatic, normocephalic Eyes- conjunctiva normal, EOMI ,conjugate gaze Ears- canals neg, bilateral TMs dull with mild retractions, shifted light reflex Nose- mild congestion, clear rhinorrhea Mouth/throat- mucous membranes moist ,oropharynx  non-erythematous Neck-  Nexus negative, good flexion and extension , rotational muscle tension- Trapezius bilateral shoulders and extending to occiput, upper back paraspinous. Good reflexes, good grip and cap fill CV- regular rate, grossly normal heart sounds,  Resp-no distress, normal respiratory effort,clear to auscultation bilaterally GI- ,no distention GU-deferred MSK- no tender, normal ROM, all extremities, ambulatory, self-care Neuro- normal speech and language, no gross focal neurological deficit appreciated, no gait instability,CNs as tested WNL Skin-warm,dry ,intact; no rash noted Psych-mood and affect grossly normal; speech and behavior grossly normal  ED Course  Procedures (including critical care time)  Labs Review Labs Reviewed - No data to display  Imaging Review No results found.  Have discussed various causes for muscle soreness. She will work on positional changes during day- possible pillow changes as night. Gentle stretching exercises Heat /cold as preferred. Husband anxious that she feel better Seasonal allergies with eustachian tube dysfunction/post nasal drip /cough-reviewed and Rx presented-encourage a trial for  at least 30 days. Or til hard freeze in [place]. If successful plan to start again in Feb 2017    MDM   1. Muscle spasms of head or neck   2. Cough     Plan: 1. diagnosis reviewed with patient 2. rx as per orders; risks, benefits, potential side effects reviewed with patient 3. Recommend supportive treatment 4. F/u prn if symptoms worsen or don't improve Discharge Medication List as of 11/21/2014  8:11 PM    START taking these medications   Details  fluticasone (FLONASE) 50 MCG/ACT nasal spray Place 2 sprays into both nostrils daily., Starting 11/21/2014, Until Discontinued, Print    ibuprofen (ADVIL,MOTRIN) 600 MG tablet Take 1 tablet (600 mg total) by mouth every 8 (eight) hours as needed for moderate pain., Starting 11/21/2014, Until  Discontinued, Print    methocarbamol (ROBAXIN-750) 750 MG tablet Take 1 tablet (750 mg total) by mouth every 8 (eight) hours as needed for muscle spasms (Do not drive or operate machinery)., Starting 11/21/2014, Until Discontinued, Print    HYDROcodone-acetaminophen (NORCO/VICODIN) 5-325 MG per tablet Take 1 tablet by mouth every 6 (six) hours as needed for moderate pain (1 tablet at bedtime , repeat during day if needed;)., Starting 11/21/2014, Until Discontinued, Print          Jan Fireman, PA-C 11/29/14 2046

## 2014-12-04 ENCOUNTER — Other Ambulatory Visit: Payer: Self-pay | Admitting: Family Medicine

## 2014-12-16 ENCOUNTER — Telehealth: Payer: Self-pay | Admitting: *Deleted

## 2014-12-16 NOTE — Telephone Encounter (Signed)
"  I ned a new script sent to [facility] for Letrozole that has no further refills."  Called patient asking who many pills she has left on hand.  Also need to know if [facility] is still her specialty pharmacy due to recent pharmacy mergers with pending name changes.  Awaiting return call from patient.

## 2014-12-19 ENCOUNTER — Other Ambulatory Visit: Payer: Self-pay

## 2014-12-19 DIAGNOSIS — C50911 Malignant neoplasm of unspecified site of right female breast: Secondary | ICD-10-CM

## 2014-12-19 MED ORDER — LETROZOLE 2.5 MG PO TABS: 2.5000 mg | ORAL_TABLET | Freq: Every day | ORAL | Status: DC

## 2014-12-19 NOTE — Telephone Encounter (Signed)
LMOVM - Refill has been sent.  Pt to call clinic if she has any questions.

## 2014-12-19 NOTE — Telephone Encounter (Signed)
12-19-2014 Terri RN has sent refill to [facility] per EMR.

## 2014-12-21 ENCOUNTER — Other Ambulatory Visit: Payer: Self-pay | Admitting: Family Medicine

## 2015-01-23 ENCOUNTER — Other Ambulatory Visit: Payer: Self-pay | Admitting: Family Medicine

## 2015-02-27 ENCOUNTER — Ambulatory Visit: Payer: No Typology Code available for payment source | Admitting: Internal Medicine

## 2015-02-27 DIAGNOSIS — Z0289 Encounter for other administrative examinations: Secondary | ICD-10-CM

## 2015-03-08 ENCOUNTER — Telehealth: Payer: Self-pay | Admitting: *Deleted

## 2015-03-08 NOTE — Telephone Encounter (Signed)
TC from [facility] requesting refill on Letrozole.  Prescription can be fax'd to  [phone]

## 2015-03-10 ENCOUNTER — Other Ambulatory Visit: Payer: Self-pay | Admitting: *Deleted

## 2015-03-10 ENCOUNTER — Telehealth: Payer: Self-pay | Admitting: *Deleted

## 2015-03-10 DIAGNOSIS — C50911 Malignant neoplasm of unspecified site of right female breast: Secondary | ICD-10-CM

## 2015-03-10 MED ORDER — LETROZOLE 2.5 MG PO TABS: 2.5000 mg | ORAL_TABLET | Freq: Every day | ORAL | Status: DC

## 2015-03-10 NOTE — Telephone Encounter (Signed)
Faxed rx for Letrozole to [facility].

## 2015-03-21 ENCOUNTER — Ambulatory Visit (INDEPENDENT_AMBULATORY_CARE_PROVIDER_SITE_OTHER): Payer: Managed Care, Other (non HMO) | Admitting: Internal Medicine

## 2015-03-21 ENCOUNTER — Encounter: Payer: Self-pay | Admitting: Internal Medicine

## 2015-03-21 ENCOUNTER — Other Ambulatory Visit (INDEPENDENT_AMBULATORY_CARE_PROVIDER_SITE_OTHER): Payer: Managed Care, Other (non HMO)

## 2015-03-21 VITALS — BP 136/78 | HR 74 | Temp 97.7°F | Resp 12 | Ht 67.0 in | Wt 197.0 lb

## 2015-03-21 DIAGNOSIS — R1110 Vomiting, unspecified: Secondary | ICD-10-CM | POA: Insufficient documentation

## 2015-03-21 DIAGNOSIS — R8299 Other abnormal findings in urine: Secondary | ICD-10-CM

## 2015-03-21 DIAGNOSIS — F329 Major depressive disorder, single episode, unspecified: Secondary | ICD-10-CM

## 2015-03-21 DIAGNOSIS — F32A Depression, unspecified: Secondary | ICD-10-CM

## 2015-03-21 DIAGNOSIS — R42 Dizziness and giddiness: Secondary | ICD-10-CM

## 2015-03-21 DIAGNOSIS — R1111 Vomiting without nausea: Secondary | ICD-10-CM

## 2015-03-21 DIAGNOSIS — R82998 Other abnormal findings in urine: Secondary | ICD-10-CM

## 2015-03-21 LAB — COMPREHENSIVE METABOLIC PANEL
ALT: 19 U/L (ref 0–35)
AST: 20 U/L (ref 0–37)
Albumin: 4 g/dL (ref 3.5–5.2)
Alkaline Phosphatase: 63 U/L (ref 39–117)
BUN: 20 mg/dL (ref 6–23)
CO2: 29 mEq/L (ref 19–32)
Calcium: 9.4 mg/dL (ref 8.4–10.5)
Chloride: 100 mEq/L (ref 96–112)
Creatinine, Ser: 1.36 mg/dL — ABNORMAL HIGH (ref 0.40–1.20)
GFR: 42.77 mL/min — ABNORMAL LOW (ref >60.00)
Glucose, Bld: 92 mg/dL (ref 70–99)
Potassium: 4 mEq/L (ref 3.5–5.1)
Sodium: 139 mEq/L (ref 135–145)
Total Bilirubin: 0.8 mg/dL (ref 0.2–1.2)
Total Protein: 6.9 g/dL (ref 6.0–8.3)

## 2015-03-21 LAB — MICROALBUMIN / CREATININE URINE RATIO
Creatinine,U: 56.8 mg/dL
Microalb Creat Ratio: 3.5 mg/g (ref 0.0–30.0)
Microalb, Ur: 2 mg/dL — ABNORMAL HIGH (ref 0.0–1.9)

## 2015-03-21 LAB — CBC
HCT: 43.5 % (ref 36.0–46.0)
Hemoglobin: 14.5 g/dL (ref 12.0–15.0)
MCHC: 33.4 g/dL (ref 30.0–36.0)
MCV: 90.7 fl (ref 78.0–100.0)
Platelets: 201 10*3/uL (ref 150.0–400.0)
RBC: 4.8 Mil/uL (ref 3.87–5.11)
RDW: 13.6 % (ref 11.5–15.5)
WBC: 7.1 10*3/uL (ref 4.0–10.5)

## 2015-03-21 LAB — LIPID PANEL
Cholesterol: 151 mg/dL (ref 0–200)
HDL: 44.1 mg/dL (ref >39.00)
LDL Cholesterol: 73 mg/dL (ref 0–99)
NonHDL: 106.41
Total CHOL/HDL Ratio: 3
Triglycerides: 167 mg/dL — ABNORMAL HIGH (ref 0.0–149.0)
VLDL: 33.4 mg/dL (ref 0.0–40.0)

## 2015-03-21 LAB — URINALYSIS, ROUTINE W REFLEX MICROSCOPIC
Bilirubin Urine: NEGATIVE
Hgb urine dipstick: NEGATIVE
Ketones, ur: NEGATIVE
Nitrite: NEGATIVE
RBC / HPF: NONE SEEN (ref 0–?)
Specific Gravity, Urine: <=1.005 — AB (ref 1.000–1.030)
Total Protein, Urine: NEGATIVE
Urine Glucose: NEGATIVE
Urobilinogen, UA: 0.2 (ref 0.0–1.0)
pH: 6.5 (ref 5.0–8.0)

## 2015-03-21 LAB — TSH: TSH: 4.05 u[IU]/mL (ref 0.35–4.50)

## 2015-03-21 LAB — HEMOGLOBIN A1C: Hgb A1c MFr Bld: 5.4 % (ref 4.6–6.5)

## 2015-03-21 MED ORDER — AMLODIPINE BESYLATE 5 MG PO TABS: 5.0000 mg | ORAL_TABLET | Freq: Every day | ORAL | Status: DC

## 2015-03-21 MED ORDER — SIMVASTATIN 40 MG PO TABS: 40.0000 mg | ORAL_TABLET | Freq: Every day | ORAL | Status: DC

## 2015-03-21 MED ORDER — ACYCLOVIR 200 MG PO CAPS: 200.0000 mg | ORAL_CAPSULE | Freq: Every day | ORAL | Status: DC

## 2015-03-21 MED ORDER — BUPROPION HCL ER (SR) 200 MG PO TB12: 200.0000 mg | ORAL_TABLET | Freq: Two times a day (BID) | ORAL | Status: DC

## 2015-03-21 NOTE — Patient Instructions (Signed)
We would strongly recommend to call Dr. Osborn Coho office to let them know about your swallowing symptoms to see if he can do an endoscopy at the same time as the colonoscopy (looks at the stomach).   We are checking the labs and urine today and will call you back about the results.

## 2015-03-21 NOTE — Progress Notes (Signed)
   Subjective:    Patient ID: Ashley Mann, female    DOB: [birth year], 54 y.o.   MRN: [id number]  HPI The patient is a 54 YO female coming in new with multiple complaints. Unfortunately due to the fact that she was very late to the visit we were not able to address all of her concerns. Her first concern is that she is having some foamy urine for 3-4 weeks. No history of the same. No burning or urgency. No frequency. Has not changed since it started and nothing else changed around the same time.  Next concern is her problems with swallowing. She does feel like she chokes on her food, and sometime regurgitates food. She has a colonoscopy coming up in January and did not mention this to her GI doctor. This has been going on for at least 6 months. Food does not feel like it gets stuck. Has not limited her eating. No change in weight.  She is also having dizziness which we do not get to discuss much. No syncope and does not feel like the room is spinning.   PMH, [facility], social history reviewed and updated.   Review of Systems  Constitutional: Negative for fever, chills, activity change, appetite change, fatigue and unexpected weight change.  HENT: Positive for congestion, postnasal drip, rhinorrhea and trouble swallowing. Negative for sinus pressure and sore throat.   Eyes: Negative.   Respiratory: Positive for cough. Negative for chest tightness, shortness of breath and wheezing.   Cardiovascular: Negative for chest pain, palpitations and leg swelling.  Gastrointestinal: Positive for vomiting. Negative for abdominal pain, diarrhea, constipation and abdominal distention.  Genitourinary: Negative for dysuria, urgency, frequency, flank pain and pelvic pain.       Foamy urine  Musculoskeletal: Negative for myalgias, back pain and arthralgias.  Skin: Negative.   Neurological: Positive for dizziness and light-headedness. Negative for syncope, weakness and headaches.  Psychiatric/Behavioral: Negative.      Objective:   Physical Exam  Constitutional: She is oriented to person, place, and time. She appears well-developed and well-nourished.  HENT:  Head: Normocephalic and atraumatic.  Oropharynx red with clear drainage  Eyes: EOM are normal.  Neck: Normal range of motion.  Cardiovascular: Normal rate and regular rhythm.   Pulmonary/Chest: Effort normal and breath sounds normal. No respiratory distress. She has no wheezes. She has no rales.  Abdominal: Soft. She exhibits no distension. There is no tenderness. There is no rebound.  Neurological: She is alert and oriented to person, place, and time. Coordination normal.  Skin: Skin is warm and dry.  Psychiatric: She has a normal mood and affect.   Filed Vitals:   03/21/15 1418  BP: 136/78  Pulse: 74  Temp: 97.7 F (36.5 C)  TempSrc: Oral  Resp: 12  Height: 5\' 7"  (1.702 m)  Weight: 197 lb (89.359 kg)  SpO2: 98%      Assessment & Plan:

## 2015-03-21 NOTE — Assessment & Plan Note (Signed)
Since she is getting colonoscopy in January will ask her to talk to her GI doctor about these symptoms to see if they feel it is appropriate to check EGD at the same time. Does not need zofran and not interfering with her eating or weight.

## 2015-03-21 NOTE — Progress Notes (Signed)
Pre visit review using our clinic review tool, if applicable. No additional management support is needed unless otherwise documented below in the visit note. 

## 2015-03-21 NOTE — Assessment & Plan Note (Signed)
She is not able to give a description of this but calls it foamy and bubbles in her urine. Checking U/A and microalbumin to creatinine ratio. Treat as appropriate. She does have history of high blood pressure and concern for some CKD (although early stage does not typically give visual changes to the urine). No history of disease that would cause fistula to cause air to enter the bladder or urethra. No recent urologic procedure.

## 2015-03-21 NOTE — Assessment & Plan Note (Signed)
No symptoms with standing in the office. Concern that this could be coming from metabolic cause. Will check labs for liver, kidney, anemia. Also checking thyroid and sugar levels.

## 2015-03-22 ENCOUNTER — Encounter: Payer: Self-pay | Admitting: Internal Medicine

## 2015-03-22 ENCOUNTER — Other Ambulatory Visit: Payer: Self-pay | Admitting: Internal Medicine

## 2015-03-22 MED ORDER — SULFAMETHOXAZOLE-TRIMETHOPRIM 800-160 MG PO TABS: 1.0000 | ORAL_TABLET | Freq: Two times a day (BID) | ORAL | Status: DC

## 2015-04-17 ENCOUNTER — Other Ambulatory Visit: Payer: Self-pay | Admitting: Geriatric Medicine

## 2015-04-17 ENCOUNTER — Encounter: Payer: Self-pay | Admitting: Internal Medicine

## 2015-04-17 DIAGNOSIS — F32A Depression, unspecified: Secondary | ICD-10-CM

## 2015-04-17 DIAGNOSIS — F329 Major depressive disorder, single episode, unspecified: Secondary | ICD-10-CM

## 2015-04-17 MED ORDER — AMLODIPINE BESYLATE 5 MG PO TABS: 5.0000 mg | ORAL_TABLET | Freq: Every day | ORAL | Status: DC

## 2015-04-17 MED ORDER — ACYCLOVIR 200 MG PO CAPS: 200.0000 mg | ORAL_CAPSULE | Freq: Every day | ORAL | Status: DC

## 2015-04-17 MED ORDER — SIMVASTATIN 40 MG PO TABS: 40.0000 mg | ORAL_TABLET | Freq: Every evening | ORAL | Status: DC

## 2015-04-17 MED ORDER — SIMVASTATIN 40 MG PO TABS: 40.0000 mg | ORAL_TABLET | Freq: Every day | ORAL | Status: DC

## 2015-04-17 MED ORDER — BUPROPION HCL ER (SR) 200 MG PO TB12: 200.0000 mg | ORAL_TABLET | Freq: Two times a day (BID) | ORAL | Status: DC

## 2015-05-08 ENCOUNTER — Other Ambulatory Visit: Payer: Self-pay | Admitting: Hematology and Oncology

## 2015-05-08 ENCOUNTER — Ambulatory Visit (HOSPITAL_BASED_OUTPATIENT_CLINIC_OR_DEPARTMENT_OTHER): Payer: Managed Care, Other (non HMO) | Admitting: Hematology and Oncology

## 2015-05-08 ENCOUNTER — Other Ambulatory Visit: Payer: Self-pay

## 2015-05-08 ENCOUNTER — Other Ambulatory Visit (HOSPITAL_BASED_OUTPATIENT_CLINIC_OR_DEPARTMENT_OTHER): Payer: Managed Care, Other (non HMO)

## 2015-05-08 ENCOUNTER — Encounter: Payer: Self-pay | Admitting: Hematology and Oncology

## 2015-05-08 ENCOUNTER — Telehealth: Payer: Self-pay | Admitting: Hematology and Oncology

## 2015-05-08 VITALS — BP 149/81 | HR 78 | Temp 97.7°F | Resp 18 | Ht 67.0 in | Wt 193.5 lb

## 2015-05-08 DIAGNOSIS — Z853 Personal history of malignant neoplasm of breast: Secondary | ICD-10-CM

## 2015-05-08 DIAGNOSIS — Z780 Asymptomatic menopausal state: Secondary | ICD-10-CM | POA: Insufficient documentation

## 2015-05-08 DIAGNOSIS — C50911 Malignant neoplasm of unspecified site of right female breast: Secondary | ICD-10-CM

## 2015-05-08 DIAGNOSIS — C50511 Malignant neoplasm of lower-outer quadrant of right female breast: Secondary | ICD-10-CM

## 2015-05-08 LAB — COMPREHENSIVE METABOLIC PANEL
ALT: 20 U/L (ref 0–55)
AST: 19 U/L (ref 5–34)
Albumin: 3.7 g/dL (ref 3.5–5.0)
Alkaline Phosphatase: 69 U/L (ref 40–150)
Anion Gap: 10 mEq/L (ref 3–11)
BUN: 12.2 mg/dL (ref 7.0–26.0)
CO2: 26 mEq/L (ref 22–29)
Calcium: 9.3 mg/dL (ref 8.4–10.4)
Chloride: 102 mEq/L (ref 98–109)
Creatinine: 1.2 mg/dL — ABNORMAL HIGH (ref 0.6–1.1)
EGFR: 50 mL/min/{1.73_m2} — ABNORMAL LOW (ref >90)
Glucose: 73 mg/dl (ref 70–140)
Potassium: 3.7 mEq/L (ref 3.5–5.1)
Sodium: 139 mEq/L (ref 136–145)
Total Bilirubin: 0.7 mg/dL (ref 0.20–1.20)
Total Protein: 7 g/dL (ref 6.4–8.3)

## 2015-05-08 LAB — CBC WITH DIFFERENTIAL/PLATELET
BASO%: 1 % (ref 0.0–2.0)
Basophils Absolute: 0 10*3/uL (ref 0.0–0.1)
EOS%: 2.9 % (ref 0.0–7.0)
Eosinophils Absolute: 0.1 10*3/uL (ref 0.0–0.5)
HCT: 41.8 % (ref 34.8–46.6)
HGB: 13.8 g/dL (ref 11.6–15.9)
LYMPH%: 31.1 % (ref 14.0–49.7)
MCH: 29.6 pg (ref 25.1–34.0)
MCHC: 33.1 g/dL (ref 31.5–36.0)
MCV: 89.5 fL (ref 79.5–101.0)
MONO#: 0.4 10*3/uL (ref 0.1–0.9)
MONO%: 8.9 % (ref 0.0–14.0)
NEUT#: 2.3 10*3/uL (ref 1.5–6.5)
NEUT%: 56.1 % (ref 38.4–76.8)
Platelets: 168 10*3/uL (ref 145–400)
RBC: 4.67 10*6/uL (ref 3.70–5.45)
RDW: 12.8 % (ref 11.2–14.5)
WBC: 4 10*3/uL (ref 3.9–10.3)
lymph#: 1.3 10*3/uL (ref 0.9–3.3)

## 2015-05-08 NOTE — Assessment & Plan Note (Signed)
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 Mammogram done  07/07/2014 is normal : Breast density category B 2. Breast exam  05/08/2015 is normal   return to clinic in 1 year to follow-up with survivorship

## 2015-05-08 NOTE — Progress Notes (Signed)
Patient Care Team: Hoyt Koch, MD as PCP - General (Internal Medicine)  SUMMARY OF ONCOLOGIC HISTORY:   Breast cancer of lower-outer quadrant of right female breast ([place])   11/05/2004 Surgery Right mastectomy:stage II multifocal invasive ductal carcinoma  with reconstruction   12/06/2004 - 03/15/2005 Chemotherapy  adjuvant chemotherapy with Taxotere, Adriamycin, Cytoxan 6 cycles   04/02/2005 - 03/31/2015 Anti-estrogen oral therapy Tamoxifen 2 years switched to letrozole in January 2010    CHIEF COMPLIANT: Follow-up on antiestrogen therapy  INTERVAL HISTORY: Ashley Mann is a 55 year old with above-mentioned history of right breast cancer treated with mastectomy followed by adjuvant chemotherapy and is currently on oral antiestrogen therapy. She was initially on tamoxifen and took omeprazole for the past 5 years. She reports to me that this is being provided free of cost for her. Even though she completed her designated duration of therapy, patient is reluctant to discontinue it.  REVIEW OF SYSTEMS:   Constitutional: Denies fevers, chills or abnormal weight loss Eyes: Denies blurriness of vision Ears, nose, mouth, throat, and face: Denies mucositis or sore throat Respiratory: Denies cough, dyspnea or wheezes Cardiovascular: Denies palpitation, chest discomfort Gastrointestinal:  Denies nausea, heartburn or change in bowel habits Skin: Denies abnormal skin rashes Lymphatics: Denies new lymphadenopathy or easy bruising Neurological:Denies numbness, tingling or new weaknesses Behavioral/Psych: Mood is stable, no new changes  Extremities: No lower extremity edema Breast:  denies any pain or lumps or nodules in either breasts All other systems were reviewed with the patient and are negative.  I have reviewed the past medical history, past surgical history, social history and family history with the patient and they are unchanged from previous note.  ALLERGIES:  has No Known  Allergies.  MEDICATIONS:  Current Outpatient Prescriptions  Medication Sig Dispense Refill  . acyclovir (ZOVIRAX) 200 MG capsule Take 1 capsule (200 mg total) by mouth daily. 90 capsule 3  . amLODipine (NORVASC) 5 MG tablet Take 1 tablet (5 mg total) by mouth daily. 90 tablet 3  . amLODipine (NORVASC) 5 MG tablet Take 1 tablet (5 mg total) by mouth daily. 15 tablet 0  . Ascorbic Acid 1000 MG CHEW Chew 1 each by mouth.    Marland Kitchen buPROPion (WELLBUTRIN SR) 200 MG 12 hr tablet Take 1 tablet (200 mg total) by mouth 2 (two) times daily. 180 tablet 3  . calcium carbonate (OS-CAL - DOSED IN MG OF ELEMENTAL CALCIUM) 1250 (500 Ca) MG tablet Take 1 tablet by mouth.    . cholecalciferol (VITAMIN D) 1000 UNITS tablet Take 1,000 Units by mouth daily.    Marland Kitchen ibuprofen (ADVIL,MOTRIN) 600 MG tablet Take 1 tablet (600 mg total) by mouth every 8 (eight) hours as needed for moderate pain. 30 tablet 0  . letrozole (FEMARA) 2.5 MG tablet Take 1 tablet (2.5 mg total) by mouth daily. 90 tablet 3  . Multiple Vitamin (MULTIVITAMIN) tablet Take 1 tablet by mouth daily.    Marland Kitchen omeprazole (PRILOSEC) 10 MG capsule Take 10 mg by mouth daily.    . simvastatin (ZOCOR) 40 MG tablet Take 1 tablet (40 mg total) by mouth every evening. 90 tablet 3  . simvastatin (ZOCOR) 40 MG tablet Take 1 tablet (40 mg total) by mouth daily. This is temporary supply until the mail order comes through. 15 tablet 0  . sulfamethoxazole-trimethoprim (BACTRIM DS,SEPTRA DS) 800-160 MG tablet Take 1 tablet by mouth 2 (two) times daily. 10 tablet 0  . vitamin E 100 UNIT capsule Take 400 Units by  mouth daily.     No current facility-administered medications for this visit.    PHYSICAL EXAMINATION: ECOG PERFORMANCE STATUS: 1 - Symptomatic but completely ambulatory  Filed Vitals:   05/08/15 1528  BP: 149/81  Pulse: 78  Temp: 97.7 F (36.5 C)  Resp: 18   Filed Weights   05/08/15 1528  Weight: 193 lb 8 oz (87.771 kg)    GENERAL:alert, no distress and  comfortable SKIN: skin color, texture, turgor are normal, no rashes or significant lesions EYES: normal, Conjunctiva are pink and non-injected, sclera clear OROPHARYNX:no exudate, no erythema and lips, buccal mucosa, and tongue normal  NECK: supple, thyroid normal size, non-tender, without nodularity LYMPH:  no palpable lymphadenopathy in the cervical, axillary or inguinal LUNGS: clear to auscultation and percussion with normal breathing effort HEART: regular rate & rhythm and no murmurs and no lower extremity edema ABDOMEN:abdomen soft, non-tender and normal bowel sounds MUSCULOSKELETAL:no cyanosis of digits and no clubbing  NEURO: alert & oriented x 3 with fluent speech, no focal motor/sensory deficits EXTREMITIES: No lower extremity edema BREAST: No palpable masses or nodules in either right or left breasts. No palpable axillary supraclavicular or infraclavicular adenopathy no breast tenderness or nipple discharge. (exam performed in the presence of a chaperone)  LABORATORY DATA:  I have reviewed the data as listed   Chemistry      Component Value Date/Time   NA 139 05/08/2015 1506   NA 139 03/21/2015 1503   NA 144 11/16/2009 1315   K 3.7 05/08/2015 1506   K 4.0 03/21/2015 1503   K 3.9 11/16/2009 1315   CL 100 03/21/2015 1503   CL 103 01/16/2012 1146   CL 102 11/16/2009 1315   CO2 26 05/08/2015 1506   CO2 29 03/21/2015 1503   CO2 29 11/16/2009 1315   BUN 12.2 05/08/2015 1506   BUN 20 03/21/2015 1503   BUN 14 11/16/2009 1315   CREATININE 1.2* 05/08/2015 1506   CREATININE 1.36* 03/21/2015 1503   CREATININE 0.9 11/16/2009 1315      Component Value Date/Time   CALCIUM 9.3 05/08/2015 1506   CALCIUM 9.4 03/21/2015 1503   CALCIUM 9.1 11/16/2009 1315   ALKPHOS 69 05/08/2015 1506   ALKPHOS 63 03/21/2015 1503   ALKPHOS 69 11/16/2009 1315   AST 19 05/08/2015 1506   AST 20 03/21/2015 1503   AST 25 11/16/2009 1315   ALT 20 05/08/2015 1506   ALT 19 03/21/2015 1503   ALT 20  11/16/2009 1315   BILITOT 0.70 05/08/2015 1506   BILITOT 0.8 03/21/2015 1503   BILITOT 1.00 11/16/2009 1315       Lab Results  Component Value Date   WBC 4.0 05/08/2015   HGB 13.8 05/08/2015   HCT 41.8 05/08/2015   MCV 89.5 05/08/2015   PLT 168 05/08/2015   NEUTROABS 2.3 05/08/2015     ASSESSMENT & PLAN:  Breast cancer of lower-outer quadrant of right female breast ([place]) 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 Mammogram done  07/07/2014 is normal : Breast density category B 2. Breast exam  05/08/2015 is normal  I would like to order a bone density test to be done April 2017. The bone density shows marked worsening of  bone health, then we may have to discontinue omeprazole. The bone density shows fairly normal results, then she may continue with until next year. We'll make decision on continuation of letrozole therapy based upon the results of bone density.   return to clinic in 1 year to follow-up to decide if she wants to continue antiestrogen therapy even further.   No orders of the defined types were placed in this encounter.   The patient has a good understanding of the overall plan. she agrees with it. she will call with any problems that may develop before the next visit here.   Rulon Eisenmenger, MD 05/08/2015

## 2015-05-08 NOTE — Telephone Encounter (Signed)
Appointments made per 2/6 pof and avs printed

## 2015-05-09 LAB — HEPATITIS C ANTIBODY: Hep C Virus Ab: <0.1 s/co ratio (ref 0.0–0.9)

## 2015-06-09 ENCOUNTER — Encounter: Payer: Self-pay | Admitting: Internal Medicine

## 2015-06-12 MED ORDER — SIMVASTATIN 20 MG PO TABS: 20.0000 mg | ORAL_TABLET | Freq: Every evening | ORAL | Status: DC

## 2015-07-13 ENCOUNTER — Ambulatory Visit
Admission: RE | Admit: 2015-07-13 | Discharge: 2015-07-13 | Disposition: A | Discharge status: Home / Self Care | Payer: Managed Care, Other (non HMO) | Source: Ambulatory Visit

## 2015-07-13 ENCOUNTER — Ambulatory Visit: Payer: Managed Care, Other (non HMO)

## 2015-07-13 ENCOUNTER — Other Ambulatory Visit: Payer: Managed Care, Other (non HMO)

## 2015-07-13 ENCOUNTER — Ambulatory Visit
Admission: RE | Admit: 2015-07-13 | Discharge: 2015-07-13 | Disposition: A | Discharge status: Home / Self Care | Payer: Managed Care, Other (non HMO) | Source: Ambulatory Visit | Attending: Hematology and Oncology | Admitting: Hematology and Oncology

## 2015-07-13 DIAGNOSIS — Z853 Personal history of malignant neoplasm of breast: Secondary | ICD-10-CM

## 2015-07-13 DIAGNOSIS — Z780 Asymptomatic menopausal state: Secondary | ICD-10-CM

## 2015-07-19 ENCOUNTER — Encounter: Payer: Self-pay | Admitting: Internal Medicine

## 2015-10-05 ENCOUNTER — Other Ambulatory Visit: Payer: Self-pay | Admitting: Gastroenterology

## 2015-10-05 DIAGNOSIS — R1084 Generalized abdominal pain: Secondary | ICD-10-CM

## 2015-10-11 ENCOUNTER — Ambulatory Visit
Admission: RE | Admit: 2015-10-11 | Discharge: 2015-10-11 | Disposition: A | Discharge status: Home / Self Care | Payer: Managed Care, Other (non HMO) | Source: Ambulatory Visit | Attending: Gastroenterology | Admitting: Gastroenterology

## 2015-10-11 DIAGNOSIS — R1084 Generalized abdominal pain: Secondary | ICD-10-CM

## 2015-10-11 MED ORDER — IOPAMIDOL (ISOVUE-300) INJECTION 61%
125.0000 mL | Freq: Once | INTRAVENOUS | Status: AC | PRN
  Administered 2015-10-11: 125 mL via INTRAVENOUS

## 2016-02-25 ENCOUNTER — Other Ambulatory Visit: Payer: Self-pay | Admitting: Hematology and Oncology

## 2016-02-25 DIAGNOSIS — C50911 Malignant neoplasm of unspecified site of right female breast: Secondary | ICD-10-CM

## 2016-04-01 ENCOUNTER — Other Ambulatory Visit: Payer: Self-pay | Admitting: Internal Medicine

## 2016-04-28 ENCOUNTER — Telehealth: Payer: Self-pay

## 2016-04-28 NOTE — Telephone Encounter (Signed)
Called and left a message with a new appt due to call day

## 2016-05-06 ENCOUNTER — Other Ambulatory Visit: Payer: Self-pay | Admitting: Emergency Medicine

## 2016-05-06 DIAGNOSIS — C50511 Malignant neoplasm of lower-outer quadrant of right female breast: Secondary | ICD-10-CM

## 2016-05-07 ENCOUNTER — Other Ambulatory Visit (HOSPITAL_BASED_OUTPATIENT_CLINIC_OR_DEPARTMENT_OTHER): Payer: Managed Care, Other (non HMO)

## 2016-05-07 ENCOUNTER — Ambulatory Visit (HOSPITAL_BASED_OUTPATIENT_CLINIC_OR_DEPARTMENT_OTHER): Payer: Managed Care, Other (non HMO) | Admitting: Hematology and Oncology

## 2016-05-07 ENCOUNTER — Encounter: Payer: Self-pay | Admitting: Hematology and Oncology

## 2016-05-07 DIAGNOSIS — Z79811 Long term (current) use of aromatase inhibitors: Secondary | ICD-10-CM

## 2016-05-07 DIAGNOSIS — Z170 Estrogen receptor positive status [ER+]: Secondary | ICD-10-CM

## 2016-05-07 DIAGNOSIS — C50511 Malignant neoplasm of lower-outer quadrant of right female breast: Secondary | ICD-10-CM

## 2016-05-07 LAB — CBC WITH DIFFERENTIAL/PLATELET
BASO%: 0.8 % (ref 0.0–2.0)
Basophils Absolute: 0 10*3/uL (ref 0.0–0.1)
EOS%: 2.3 % (ref 0.0–7.0)
Eosinophils Absolute: 0.1 10*3/uL (ref 0.0–0.5)
HCT: 38.4 % (ref 34.8–46.6)
HGB: 12.8 g/dL (ref 11.6–15.9)
LYMPH%: 30.2 % (ref 14.0–49.7)
MCH: 31.1 pg (ref 25.1–34.0)
MCHC: 33.4 g/dL (ref 31.5–36.0)
MCV: 93 fL (ref 79.5–101.0)
MONO#: 0.4 10*3/uL (ref 0.1–0.9)
MONO%: 6.8 % (ref 0.0–14.0)
NEUT#: 3.2 10*3/uL (ref 1.5–6.5)
NEUT%: 59.9 % (ref 38.4–76.8)
Platelets: 183 10*3/uL (ref 145–400)
RBC: 4.13 10*6/uL (ref 3.70–5.45)
RDW: 12.9 % (ref 11.2–14.5)
WBC: 5.3 10*3/uL (ref 3.9–10.3)
lymph#: 1.6 10*3/uL (ref 0.9–3.3)

## 2016-05-07 LAB — COMPREHENSIVE METABOLIC PANEL
ALT: 20 U/L (ref 0–55)
AST: 19 U/L (ref 5–34)
Albumin: 4.1 g/dL (ref 3.5–5.0)
Alkaline Phosphatase: 66 U/L (ref 40–150)
Anion Gap: 7 mEq/L (ref 3–11)
BUN: 17.9 mg/dL (ref 7.0–26.0)
CO2: 27 mEq/L (ref 22–29)
Calcium: 9.6 mg/dL (ref 8.4–10.4)
Chloride: 104 mEq/L (ref 98–109)
Creatinine: 1.1 mg/dL (ref 0.6–1.1)
EGFR: 56 mL/min/{1.73_m2} — ABNORMAL LOW (ref >90)
Glucose: 86 mg/dl (ref 70–140)
Potassium: 4.9 mEq/L (ref 3.5–5.1)
Sodium: 139 mEq/L (ref 136–145)
Total Bilirubin: 0.8 mg/dL (ref 0.20–1.20)
Total Protein: 7.3 g/dL (ref 6.4–8.3)

## 2016-05-07 NOTE — Progress Notes (Signed)
Patient Care Team: Hoyt Koch, MD as PCP - General (Internal Medicine)  DIAGNOSIS:  Encounter Diagnosis  Name Primary?  . Malignant neoplasm of lower-outer quadrant of right breast of female, estrogen receptor positive ([place])     SUMMARY OF ONCOLOGIC HISTORY:   Breast cancer of lower-outer quadrant of right female breast ([place])   11/05/2004 Surgery    Right mastectomy:stage II multifocal invasive ductal carcinoma  with reconstruction      12/06/2004 - 03/15/2005 Chemotherapy     adjuvant chemotherapy with Taxotere, Adriamycin, Cytoxan 6 cycles      04/02/2005 -  Anti-estrogen oral therapy    Tamoxifen 2 years switched to letrozole in January 2010       CHIEF COMPLIANT: Complaining of left lower quadrant abdominal pain, chronic dry cough  INTERVAL HISTORY: Ashley Mann is a 56 year old with above-mentioned history of right breast cancer treated with mastectomy and adjuvant chemotherapy and has been on letrozole. Overall she is taken antiestrogen therapy for 11 years. She is continuing to remain on antiestrogen therapy even though there is no data beyond 10 years. She is complaining of some abdominal pain for which she has seen gastroenterology who performed CT of the abdomen. The CT scan does not show any evidence of malignancy. There is no clear explanation for her abdominal pain other than constipation. She is also complaining of a chronic dry cough that has been going on for the past year. This cough started even before she started on lisinopril. She has taken antacids and it does not seem to improve her cough. She attributes the abdominal pain to have started after she had undergone colonoscopy and endoscopy about a year ago.  REVIEW OF SYSTEMS:   Constitutional: Denies fevers, chills or abnormal weight loss Eyes: Denies blurriness of vision Ears, nose, mouth, throat, and face: Denies mucositis or sore throat Respiratory: Denies cough, dyspnea or  wheezes Cardiovascular: Denies palpitation, chest discomfort Gastrointestinal:  Abdominal pain and constipation, burning discomfort in the skin Skin: Denies abnormal skin rashes Lymphatics: Denies new lymphadenopathy or easy bruising Neurological:Denies numbness, tingling or new weaknesses Behavioral/Psych: Mood is stable, no new changes  Extremities: No lower extremity edema Breast:  denies any pain or lumps or nodules in either breasts All other systems were reviewed with the patient and are negative.  I have reviewed the past medical history, past surgical history, social history and family history with the patient and they are unchanged from previous note.  ALLERGIES:  has No Known Allergies.  MEDICATIONS:  Current Outpatient Prescriptions  Medication Sig Dispense Refill  . acyclovir (ZOVIRAX) 200 MG capsule Take 1 capsule (200 mg total) by mouth daily. 90 capsule 3  . amLODipine (NORVASC) 5 MG tablet Take 1 tablet (5 mg total) by mouth daily. 90 tablet 3  . Ascorbic Acid 1000 MG CHEW Chew 1 each by mouth.    Marland Kitchen buPROPion (WELLBUTRIN SR) 200 MG 12 hr tablet Take 1 tablet (200 mg total) by mouth 2 (two) times daily. 180 tablet 3  . calcium carbonate (OS-CAL - DOSED IN MG OF ELEMENTAL CALCIUM) 1250 (500 Ca) MG tablet Take 1 tablet by mouth.    . cholecalciferol (VITAMIN D) 1000 UNITS tablet Take 1,000 Units by mouth daily.    Marland Kitchen ibuprofen (ADVIL,MOTRIN) 600 MG tablet Take 1 tablet (600 mg total) by mouth every 8 (eight) hours as needed for moderate pain. 30 tablet 0  . letrozole (FEMARA) 2.5 MG tablet TAKE 1 TABLET DAILY 90 tablet 3  .  Multiple Vitamin (MULTIVITAMIN) tablet Take 1 tablet by mouth daily.    Marland Kitchen omeprazole (PRILOSEC) 10 MG capsule Take 10 mg by mouth daily.    . simvastatin (ZOCOR) 20 MG tablet Take 1 tablet (20 mg total) by mouth every evening. 90 tablet 3  . sulfamethoxazole-trimethoprim (BACTRIM DS,SEPTRA DS) 800-160 MG tablet Take 1 tablet by mouth 2 (two) times daily.  10 tablet 0  . vitamin E 100 UNIT capsule Take 400 Units by mouth daily.     No current facility-administered medications for this visit.     PHYSICAL EXAMINATION: ECOG PERFORMANCE STATUS: 1 - Symptomatic but completely ambulatory  Vitals:   05/07/16 1528  BP: (!) 130/58  Pulse: 65  Resp: 18  Temp: 97.9 F (36.6 C)   Filed Weights   05/07/16 1528  Weight: 194 lb 9.6 oz (88.3 kg)    GENERAL:alert, no distress and comfortable SKIN: skin color, texture, turgor are normal, no rashes or significant lesions EYES: normal, Conjunctiva are pink and non-injected, sclera clear OROPHARYNX:no exudate, no erythema and lips, buccal mucosa, and tongue normal  NECK: supple, thyroid normal size, non-tender, without nodularity LYMPH:  no palpable lymphadenopathy in the cervical, axillary or inguinal LUNGS: clear to auscultation and percussion with normal breathing effort HEART: regular rate & rhythm and no murmurs and no lower extremity edema ABDOMEN:abdomen soft, non-tender and normal bowel sounds MUSCULOSKELETAL:no cyanosis of digits and no clubbing  NEURO: alert & oriented x 3 with fluent speech, no focal motor/sensory deficits EXTREMITIES: No lower extremity edema   LABORATORY DATA:  I have reviewed the data as listed   Chemistry      Component Value Date/Time   NA 139 05/07/2016 1509   K 4.9 05/07/2016 1509   CL 100 03/21/2015 1503   CL 103 01/16/2012 1146   CO2 27 05/07/2016 1509   BUN 17.9 05/07/2016 1509   CREATININE 1.1 05/07/2016 1509      Component Value Date/Time   CALCIUM 9.6 05/07/2016 1509   ALKPHOS 66 05/07/2016 1509   AST 19 05/07/2016 1509   ALT 20 05/07/2016 1509   BILITOT 0.80 05/07/2016 1509       Lab Results  Component Value Date   WBC 5.3 05/07/2016   HGB 12.8 05/07/2016   HCT 38.4 05/07/2016   MCV 93.0 05/07/2016   PLT 183 05/07/2016   NEUTROABS 3.2 05/07/2016    ASSESSMENT & PLAN:  Breast cancer of lower-outer quadrant of right female breast  ([place]) 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 Mammogram done  07/07/2015 is normal : Breast density category B 2. Breast exam  05/07/2016 is normal 3. Bone density test April 2017: T score -0.4 normal  Abdominal pain: I suspect this could be neurogenic in nature. There is no cutaneous lesions to suggest zoster. Chronic dry cough: I discussed with her that the differential diagnosis is between acid reflux versus asthma versus medications. I instructed her to stop on medication at a time and see if her symptoms improved. I instructed her to start with lisinopril. However if her blood pressure rises up significantly and she should probably go back on it.  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 She wanted to get a low-dose CT scan today. I discussed with her that the chronic dry cough is most likely not due to any long primary malignancy but could be related to medications. However if the medication trials do not yield results, we are happy to obtain a low-dose CT scan for lung cancer evaluation.   return to clinic in 1 year to follow-up   I spent 25 minutes talking to the patient of which more than half was spent in counseling and coordination of care.  No orders of the defined types were placed in this encounter.  The patient has a good understanding of the overall plan. she agrees with it. she will call with any problems that may develop before the next visit here.   Rulon Eisenmenger, MD 05/07/16

## 2016-05-07 NOTE — Assessment & Plan Note (Signed)
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 Mammogram done  07/07/2015 is normal : Breast density category B 2. Breast exam  05/07/2016 is normal  Abdominal pain: I suspect this could be neurogenic in nature. There is no cutaneous lesions to suggest zoster. Chronic dry cough: I discussed with her that the differential diagnosis is between acid reflux versus asthma versus medications. I instructed her to stop on medication at a time and see if her symptoms improved. I instructed her to start with lisinopril. However if her blood pressure rises up significantly and she should probably go back on it.  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 She wanted to get a low-dose CT scan today. I discussed with her that the chronic dry cough is most likely not due to any long primary malignancy but could be related to medications. However if the medication trials do not yield results, we are happy to obtain a low-dose CT scan for lung cancer evaluation.   return to clinic in 1 year to follow-up

## 2016-05-09 ENCOUNTER — Ambulatory Visit: Payer: Managed Care, Other (non HMO) | Admitting: Hematology and Oncology

## 2016-05-09 ENCOUNTER — Other Ambulatory Visit: Payer: Managed Care, Other (non HMO)

## 2016-05-28 ENCOUNTER — Other Ambulatory Visit: Payer: Self-pay | Admitting: Hematology and Oncology

## 2016-05-28 DIAGNOSIS — Z1231 Encounter for screening mammogram for malignant neoplasm of breast: Secondary | ICD-10-CM

## 2016-07-17 ENCOUNTER — Ambulatory Visit
Admission: RE | Admit: 2016-07-17 | Discharge: 2016-07-17 | Disposition: A | Discharge status: Home / Self Care | Payer: Managed Care, Other (non HMO) | Source: Ambulatory Visit | Attending: Hematology and Oncology | Admitting: Hematology and Oncology

## 2016-07-17 DIAGNOSIS — Z1231 Encounter for screening mammogram for malignant neoplasm of breast: Secondary | ICD-10-CM

## 2016-07-18 ENCOUNTER — Ambulatory Visit: Payer: Managed Care, Other (non HMO)

## 2016-08-10 ENCOUNTER — Encounter: Payer: Self-pay | Admitting: Hematology and Oncology

## 2016-08-16 ENCOUNTER — Other Ambulatory Visit: Payer: Self-pay | Admitting: Emergency Medicine

## 2016-08-20 ENCOUNTER — Other Ambulatory Visit: Payer: Self-pay

## 2016-08-20 DIAGNOSIS — C50511 Malignant neoplasm of lower-outer quadrant of right female breast: Secondary | ICD-10-CM

## 2016-08-20 DIAGNOSIS — Z170 Estrogen receptor positive status [ER+]: Secondary | ICD-10-CM

## 2016-09-11 ENCOUNTER — Encounter: Payer: Self-pay | Admitting: Hematology and Oncology

## 2016-09-17 ENCOUNTER — Ambulatory Visit (HOSPITAL_COMMUNITY): Payer: Managed Care, Other (non HMO)

## 2016-09-19 ENCOUNTER — Ambulatory Visit (HOSPITAL_COMMUNITY)
Admission: RE | Admit: 2016-09-19 | Discharge: 2016-09-19 | Disposition: A | Discharge status: Home / Self Care | Payer: Managed Care, Other (non HMO) | Source: Ambulatory Visit | Attending: Hematology and Oncology | Admitting: Hematology and Oncology

## 2016-09-19 DIAGNOSIS — K449 Diaphragmatic hernia without obstruction or gangrene: Secondary | ICD-10-CM | POA: Diagnosis not present

## 2016-09-19 DIAGNOSIS — C50511 Malignant neoplasm of lower-outer quadrant of right female breast: Secondary | ICD-10-CM

## 2016-09-19 DIAGNOSIS — Z170 Estrogen receptor positive status [ER+]: Secondary | ICD-10-CM | POA: Diagnosis not present

## 2016-09-19 DIAGNOSIS — K769 Liver disease, unspecified: Secondary | ICD-10-CM | POA: Diagnosis not present

## 2016-09-19 DIAGNOSIS — R918 Other nonspecific abnormal finding of lung field: Secondary | ICD-10-CM | POA: Insufficient documentation

## 2016-09-19 DIAGNOSIS — N289 Disorder of kidney and ureter, unspecified: Secondary | ICD-10-CM | POA: Diagnosis not present

## 2016-09-19 DIAGNOSIS — N200 Calculus of kidney: Secondary | ICD-10-CM | POA: Diagnosis not present

## 2016-09-19 DIAGNOSIS — Z923 Personal history of irradiation: Secondary | ICD-10-CM | POA: Diagnosis not present

## 2016-09-19 MED ORDER — IOPAMIDOL (ISOVUE-300) INJECTION 61%
INTRAVENOUS | Status: AC
  Administered 2016-09-19: 100 mL
  Filled 2016-09-19: qty 100

## 2016-09-23 ENCOUNTER — Telehealth: Payer: Self-pay | Admitting: Hematology and Oncology

## 2016-09-23 NOTE — Telephone Encounter (Signed)
Pt returned call. Dr Geralyn Flash note read to her. She did receive 38 XRT to right breast in 2007 after mastectomy and chemo.

## 2016-09-23 NOTE — Telephone Encounter (Signed)
I called the patient and left a message to call me back. Patient's CT showed scarring of the right upper lung. I do not believe the patient has never received radiation therapy. Because of this we may obtain another CT scan in 3-6 months.

## 2016-09-27 ENCOUNTER — Telehealth: Payer: Self-pay | Admitting: Hematology and Oncology

## 2016-09-27 ENCOUNTER — Other Ambulatory Visit: Payer: Self-pay | Admitting: Hematology and Oncology

## 2016-09-27 DIAGNOSIS — J984 Other disorders of lung: Secondary | ICD-10-CM

## 2016-09-27 NOTE — Telephone Encounter (Signed)
I left a voicemail for the patient that the CT scan shows scar tissue in the right upper lung. There is no concern for cancer. However radiologist recommending a follow-up CT scan in 6 months. I will set her up for a CT of the chest in 6 months. I instructed her to call me if she has any questions about this.

## 2016-10-01 ENCOUNTER — Other Ambulatory Visit: Payer: Self-pay | Admitting: Hematology and Oncology

## 2017-02-12 ENCOUNTER — Other Ambulatory Visit: Payer: Self-pay | Admitting: Hematology and Oncology

## 2017-02-12 DIAGNOSIS — C50911 Malignant neoplasm of unspecified site of right female breast: Secondary | ICD-10-CM

## 2017-03-14 ENCOUNTER — Other Ambulatory Visit: Payer: Self-pay | Admitting: Nurse Practitioner

## 2017-03-14 DIAGNOSIS — R079 Chest pain, unspecified: Secondary | ICD-10-CM

## 2017-03-14 DIAGNOSIS — N644 Mastodynia: Secondary | ICD-10-CM

## 2017-03-21 ENCOUNTER — Other Ambulatory Visit: Payer: Self-pay | Admitting: Nurse Practitioner

## 2017-03-21 ENCOUNTER — Ambulatory Visit
Admission: RE | Admit: 2017-03-21 | Discharge: 2017-03-21 | Disposition: A | Discharge status: Home / Self Care | Payer: 59 | Source: Ambulatory Visit | Attending: Nurse Practitioner | Admitting: Nurse Practitioner

## 2017-03-21 DIAGNOSIS — N644 Mastodynia: Secondary | ICD-10-CM

## 2017-03-21 DIAGNOSIS — N6310 Unspecified lump in the right breast, unspecified quadrant: Secondary | ICD-10-CM

## 2017-03-31 ENCOUNTER — Other Ambulatory Visit: Payer: 59

## 2017-05-08 ENCOUNTER — Ambulatory Visit: Payer: Managed Care, Other (non HMO) | Admitting: Hematology and Oncology

## 2017-05-08 ENCOUNTER — Other Ambulatory Visit: Payer: Managed Care, Other (non HMO)

## 2017-05-14 ENCOUNTER — Other Ambulatory Visit: Payer: Self-pay

## 2017-05-14 DIAGNOSIS — C50511 Malignant neoplasm of lower-outer quadrant of right female breast: Secondary | ICD-10-CM

## 2017-05-14 DIAGNOSIS — Z170 Estrogen receptor positive status [ER+]: Secondary | ICD-10-CM

## 2017-05-15 ENCOUNTER — Inpatient Hospital Stay: Payer: 59 | Admitting: Hematology and Oncology

## 2017-05-15 ENCOUNTER — Inpatient Hospital Stay: Payer: 59 | Attending: Hematology and Oncology

## 2017-05-15 ENCOUNTER — Telehealth: Payer: Self-pay | Admitting: Hematology and Oncology

## 2017-05-15 DIAGNOSIS — R109 Unspecified abdominal pain: Secondary | ICD-10-CM

## 2017-05-15 DIAGNOSIS — Z9221 Personal history of antineoplastic chemotherapy: Secondary | ICD-10-CM

## 2017-05-15 DIAGNOSIS — Z170 Estrogen receptor positive status [ER+]: Secondary | ICD-10-CM

## 2017-05-15 DIAGNOSIS — R0789 Other chest pain: Secondary | ICD-10-CM

## 2017-05-15 DIAGNOSIS — Z79811 Long term (current) use of aromatase inhibitors: Secondary | ICD-10-CM | POA: Insufficient documentation

## 2017-05-15 DIAGNOSIS — C50511 Malignant neoplasm of lower-outer quadrant of right female breast: Secondary | ICD-10-CM | POA: Diagnosis not present

## 2017-05-15 DIAGNOSIS — Z79899 Other long term (current) drug therapy: Secondary | ICD-10-CM | POA: Diagnosis not present

## 2017-05-15 DIAGNOSIS — Z9011 Acquired absence of right breast and nipple: Secondary | ICD-10-CM | POA: Diagnosis not present

## 2017-05-15 LAB — CMP (CANCER CENTER ONLY)
ALT: 25 U/L (ref 0–55)
AST: 23 U/L (ref 5–34)
Albumin: 4 g/dL (ref 3.5–5.0)
Alkaline Phosphatase: 62 U/L (ref 40–150)
Anion gap: 11 (ref 3–11)
BUN: 18 mg/dL (ref 7–26)
CO2: 25 mmol/L (ref 22–29)
Calcium: 9 mg/dL (ref 8.4–10.4)
Chloride: 102 mmol/L (ref 98–109)
Creatinine: 1.11 mg/dL — ABNORMAL HIGH (ref 0.60–1.10)
GFR, Est AFR Am: >60 mL/min (ref >60)
GFR, Estimated: 54 mL/min — ABNORMAL LOW (ref >60)
Glucose, Bld: 85 mg/dL (ref 70–140)
Potassium: 4.1 mmol/L (ref 3.5–5.1)
Sodium: 138 mmol/L (ref 136–145)
Total Bilirubin: 0.9 mg/dL (ref 0.2–1.2)
Total Protein: 7.2 g/dL (ref 6.4–8.3)

## 2017-05-15 LAB — CBC WITH DIFFERENTIAL (CANCER CENTER ONLY)
Basophils Absolute: 0 10*3/uL (ref 0.0–0.1)
Basophils Relative: 0 %
Eosinophils Absolute: 0.1 10*3/uL (ref 0.0–0.5)
Eosinophils Relative: 3 %
HCT: 40.3 % (ref 34.8–46.6)
Hemoglobin: 13.5 g/dL (ref 11.6–15.9)
Lymphocytes Relative: 30 %
Lymphs Abs: 1.4 10*3/uL (ref 0.9–3.3)
MCH: 31.5 pg (ref 25.1–34.0)
MCHC: 33.5 g/dL (ref 31.5–36.0)
MCV: 93.9 fL (ref 79.5–101.0)
Monocytes Absolute: 0.4 10*3/uL (ref 0.1–0.9)
Monocytes Relative: 8 %
Neutro Abs: 2.8 10*3/uL (ref 1.5–6.5)
Neutrophils Relative %: 59 %
Platelet Count: 166 10*3/uL (ref 145–400)
RBC: 4.29 MIL/uL (ref 3.70–5.45)
RDW: 12.9 % (ref 11.2–14.5)
WBC Count: 4.8 10*3/uL (ref 3.9–10.3)

## 2017-05-15 NOTE — Assessment & Plan Note (Addendum)
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 Mammogram done   03/21/2017: Indeterminate right breast mass possibly fat necrosis 0.3 cm, radiology recommended a biopsy.  I will need to set her up for a ultrasound-guided biopsy of this lesion. 2. Breast exam   05/15/2017 is normal 3. Bone density test April 2017: T score -0.4 normal  Abdominal pain: I suspect this could be neurogenic in nature Chronic dry cough:   We will plan to repeat another CT scan to follow-up on the scar tissue in the right upper lung.

## 2017-05-15 NOTE — Progress Notes (Addendum)
Patient Care Team: Danelle Berry, NP as PCP - General (Nurse Practitioner)  DIAGNOSIS:  Encounter Diagnosis  Name Primary?  . Malignant neoplasm of lower-outer quadrant of right breast of female, estrogen receptor positive ([place])     SUMMARY OF ONCOLOGIC HISTORY:   Breast cancer of lower-outer quadrant of right female breast ([place])   11/05/2004 Surgery    Right mastectomy:stage II multifocal invasive ductal carcinoma  with reconstruction      12/06/2004 - 03/15/2005 Chemotherapy     adjuvant chemotherapy with Taxotere, Adriamycin, Cytoxan 6 cycles      04/02/2005 -  Anti-estrogen oral therapy    Tamoxifen 2 years switched to letrozole in January 2010       CHIEF COMPLIANT: Follow-up on letrozole therapy  INTERVAL HISTORY: Ashley Mann is a 57 year old with above-mentioned history of right breast cancer treated with mastectomy and reconstruction followed by adjuvant chemotherapy and is currently on antiestrogen therapy since 2007.  She is complaining of soreness in the chest wall.  She has had CT scans 6 months ago which revealed scar tissue.  She has been referred to pulmonary for a chronic dry cough.  She was noted to have an indeterminate lesion in the right breast at the 12 o'clock position which biopsy was recommended but she is not the intent being on obtaining a biopsy and is here today to seek my opinion on this.  REVIEW OF SYSTEMS:   Constitutional: Denies fevers, chills or abnormal weight loss Eyes: Denies blurriness of vision Ears, nose, mouth, throat, and face: Denies mucositis or sore throat Respiratory: Denies cough, dyspnea or wheezes Cardiovascular: Denies palpitation, chest discomfort Gastrointestinal:  Denies nausea, heartburn or change in bowel habits Skin: Denies abnormal skin rashes Lymphatics: Denies new lymphadenopathy or easy bruising Neurological:Denies numbness, tingling or new weaknesses Behavioral/Psych: Mood is stable, no new changes    Extremities: No lower extremity edema Breast: Chest wall discomfort and increased sensitivity All other systems were reviewed with the patient and are negative.  I have reviewed the past medical history, past surgical history, social history and family history with the patient and they are unchanged from previous note.  ALLERGIES:  has No Known Allergies.  MEDICATIONS:  Current Outpatient Medications  Medication Sig Dispense Refill  . Ascorbic Acid 1000 MG CHEW Chew 1 each by mouth.    Marland Kitchen atorvastatin (LIPITOR) 10 MG tablet Take 10 mg by mouth daily.    . cholecalciferol (VITAMIN D) 1000 UNITS tablet Take 1,000 Units by mouth daily.    Marland Kitchen ibuprofen (ADVIL,MOTRIN) 600 MG tablet Take 1 tablet (600 mg total) by mouth every 8 (eight) hours as needed for moderate pain. 30 tablet 0  . letrozole (FEMARA) 2.5 MG tablet TAKE 1 TABLET DAILY 90 tablet 3  . losartan (COZAAR) 50 MG tablet Take 50 mg by mouth daily.    . Multiple Vitamin (MULTIVITAMIN) tablet Take 1 tablet by mouth daily.    . ranitidine (ZANTAC) 150 MG tablet Take 150 mg by mouth at bedtime.    . sertraline (ZOLOFT) 100 MG tablet Take 150 mg by mouth daily.     No current facility-administered medications for this visit.     PHYSICAL EXAMINATION: ECOG PERFORMANCE STATUS: 1 - Symptomatic but completely ambulatory  Vitals:   05/15/17 1446  BP: (!) 148/72  Pulse: (!) 59  Resp: 18  Temp: 98 F (36.7 C)  SpO2: 100%   Filed Weights   05/15/17 1446  Weight: 204 lb 3.2 oz (92.6 kg)  GENERAL:alert, no distress and comfortable SKIN: skin color, texture, turgor are normal, no rashes or significant lesions EYES: normal, Conjunctiva are pink and non-injected, sclera clear OROPHARYNX:no exudate, no erythema and lips, buccal mucosa, and tongue normal  NECK: supple, thyroid normal size, non-tender, without nodularity LYMPH:  no palpable lymphadenopathy in the cervical, axillary or inguinal LUNGS: clear to auscultation and  percussion with normal breathing effort HEART: regular rate & rhythm and no murmurs and no lower extremity edema ABDOMEN:abdomen soft, non-tender and normal bowel sounds MUSCULOSKELETAL:no cyanosis of digits and no clubbing  NEURO: alert & oriented x 3 with fluent speech, no focal motor/sensory deficits EXTREMITIES: No lower extremity edema BREAST: Small palpable area of concern at 12 o'clock position right breast. No palpable axillary supraclavicular or infraclavicular adenopathy no breast tenderness or nipple discharge. (exam performed in the presence of a chaperone)  LABORATORY DATA:  I have reviewed the data as listed CMP Latest Ref Rng & Units 05/15/2017 05/07/2016 05/08/2015  Glucose 70 - 140 mg/dL 85 86 73  BUN 7 - 26 mg/dL 18 17.9 12.2  Creatinine 0.60 - 1.10 mg/dL 1.11(H) 1.1 1.2(H)  Sodium 136 - 145 mmol/L 138 139 139  Potassium 3.5 - 5.1 mmol/L 4.1 4.9 3.7  Chloride 98 - 109 mmol/L 102 - -  CO2 22 - 29 mmol/L 25 27 26   Calcium 8.4 - 10.4 mg/dL 9.0 9.6 9.3  Total Protein 6.4 - 8.3 g/dL 7.2 7.3 7.0  Total Bilirubin 0.2 - 1.2 mg/dL 0.9 0.80 0.70  Alkaline Phos 40 - 150 U/L 62 66 69  AST 5 - 34 U/L 23 19 19   ALT 0 - 55 U/L 25 20 20     Lab Results  Component Value Date   WBC 4.8 05/15/2017   HGB 12.8 05/07/2016   HCT 40.3 05/15/2017   MCV 93.9 05/15/2017   PLT 166 05/15/2017   NEUTROABS 2.8 05/15/2017    ASSESSMENT & PLAN:  Breast cancer of lower-outer quadrant of right female breast ([place]) 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 Mammogram done   03/21/2017: Indeterminate right breast mass possibly fat necrosis 0.3 cm, radiology recommended a  biopsy.  We discussed the pros and cons of obtaining a biopsy versus follow-up.  She is not keen on getting a biopsy at this time.  So we decided to follow-up about it in 6 months with another ultrasound.   2. Breast exam   05/15/2017  small palpable area of concern at 12 o'clock position right breast 3. Bone density test April 2017: T score -0.4 normal  Abdominal pain: I suspect this could be neurogenic in nature Chronic dry cough: Patient is seeing pulmonary.  They will decide if she needs another CT scan for follow-up of the scar tissue in the lungs.  Chest wall pain: If the pulmonary evaluation does not reveal any abnormality, patient may need another echocardiogram or a stress test to evaluate the cause of her retrosternal pain.  It could also be esophageal discomfort.  I discussed with her to follow-up with her primary care physician to continue with the workup.  Return to clinic in July for follow-up.    I spent 25 minutes talking to the patient of which more  than half was spent in counseling and coordination of care.  Orders Placed This Encounter  Procedures  . US BREAST LTD UNI RIGHT INC AXILLA    Standing Status:   Future    Standing Expiration Date:   07/14/2018    Order Specific Question:   Reason for Exam (SYMPTOM  OR DIAGNOSIS REQUIRED)    Answer:   Rigth breast 12 o clock position nodule reevaluation    Order Specific Question:   Preferred imaging location?    Answer:   [facility]   The patient has a good understanding of the overall plan. she agrees with it. she will call with any problems that may develop before the next visit here.   Harriette Ohara, MD 05/15/17

## 2017-05-15 NOTE — Telephone Encounter (Signed)
Gave patient AVs and calendar of upcoming July appointments.  °

## 2017-07-02 ENCOUNTER — Other Ambulatory Visit: Payer: Self-pay | Admitting: Hematology and Oncology

## 2017-07-02 DIAGNOSIS — Z170 Estrogen receptor positive status [ER+]: Secondary | ICD-10-CM

## 2017-07-02 DIAGNOSIS — C50511 Malignant neoplasm of lower-outer quadrant of right female breast: Secondary | ICD-10-CM

## 2017-10-07 ENCOUNTER — Telehealth: Payer: Self-pay | Admitting: Hematology and Oncology

## 2017-10-07 NOTE — Telephone Encounter (Signed)
Patient called to cancel do not have insurance

## 2017-10-09 ENCOUNTER — Ambulatory Visit: Payer: 59 | Admitting: Hematology and Oncology

## 2017-11-04 ENCOUNTER — Other Ambulatory Visit (HOSPITAL_COMMUNITY): Payer: Self-pay | Admitting: *Deleted

## 2017-11-04 DIAGNOSIS — N6310 Unspecified lump in the right breast, unspecified quadrant: Secondary | ICD-10-CM

## 2017-11-04 DIAGNOSIS — N644 Mastodynia: Secondary | ICD-10-CM

## 2017-11-04 DIAGNOSIS — Z853 Personal history of malignant neoplasm of breast: Secondary | ICD-10-CM

## 2017-12-16 ENCOUNTER — Ambulatory Visit (HOSPITAL_COMMUNITY): Payer: Medicaid Other

## 2017-12-16 ENCOUNTER — Other Ambulatory Visit: Payer: Medicaid Other

## 2017-12-16 ENCOUNTER — Inpatient Hospital Stay: Admission: RE | Admit: 2017-12-16 | Payer: Medicaid Other | Source: Ambulatory Visit

## 2018-01-26 ENCOUNTER — Other Ambulatory Visit: Payer: Self-pay | Admitting: Obstetrics and Gynecology

## 2018-01-26 DIAGNOSIS — N6310 Unspecified lump in the right breast, unspecified quadrant: Secondary | ICD-10-CM

## 2018-02-10 ENCOUNTER — Ambulatory Visit
Admission: RE | Admit: 2018-02-10 | Discharge: 2018-02-10 | Disposition: A | Discharge status: Home / Self Care | Payer: Medicaid Other | Source: Ambulatory Visit | Attending: Obstetrics and Gynecology | Admitting: Obstetrics and Gynecology

## 2018-02-10 ENCOUNTER — Other Ambulatory Visit: Payer: Self-pay | Admitting: Obstetrics and Gynecology

## 2018-02-10 DIAGNOSIS — N6310 Unspecified lump in the right breast, unspecified quadrant: Secondary | ICD-10-CM

## 2019-03-29 ENCOUNTER — Other Ambulatory Visit: Payer: Self-pay | Admitting: Obstetrics and Gynecology

## 2019-03-29 DIAGNOSIS — N6310 Unspecified lump in the right breast, unspecified quadrant: Secondary | ICD-10-CM

## 2019-04-14 ENCOUNTER — Ambulatory Visit
Admission: RE | Admit: 2019-04-14 | Discharge: 2019-04-14 | Disposition: A | Discharge status: Home / Self Care | Payer: 59 | Source: Ambulatory Visit | Attending: Obstetrics and Gynecology | Admitting: Obstetrics and Gynecology

## 2019-04-14 ENCOUNTER — Other Ambulatory Visit: Payer: Self-pay

## 2019-04-14 ENCOUNTER — Ambulatory Visit
Admission: RE | Admit: 2019-04-14 | Discharge: 2019-04-14 | Disposition: A | Discharge status: Home / Self Care | Payer: Medicaid Other | Source: Ambulatory Visit | Attending: Obstetrics and Gynecology | Admitting: Obstetrics and Gynecology

## 2019-04-14 DIAGNOSIS — N6310 Unspecified lump in the right breast, unspecified quadrant: Secondary | ICD-10-CM

## 2019-05-03 ENCOUNTER — Other Ambulatory Visit: Payer: Self-pay | Admitting: Gastroenterology

## 2019-05-03 DIAGNOSIS — R1084 Generalized abdominal pain: Secondary | ICD-10-CM

## 2019-05-03 DIAGNOSIS — R110 Nausea: Secondary | ICD-10-CM

## 2019-05-10 ENCOUNTER — Ambulatory Visit
Admission: RE | Admit: 2019-05-10 | Discharge: 2019-05-10 | Disposition: A | Discharge status: Home / Self Care | Payer: 59 | Source: Ambulatory Visit | Attending: Gastroenterology | Admitting: Gastroenterology

## 2019-05-10 DIAGNOSIS — R1084 Generalized abdominal pain: Secondary | ICD-10-CM

## 2019-05-10 DIAGNOSIS — R110 Nausea: Secondary | ICD-10-CM

## 2019-05-10 MED ORDER — IOPAMIDOL (ISOVUE-300) INJECTION 61%
100.0000 mL | Freq: Once | INTRAVENOUS | Status: AC | PRN
  Administered 2019-05-10: 100 mL via INTRAVENOUS

## 2019-06-18 ENCOUNTER — Ambulatory Visit: Payer: Self-pay | Admitting: Family Medicine

## 2019-07-12 ENCOUNTER — Other Ambulatory Visit: Payer: Self-pay | Admitting: Internal Medicine

## 2019-07-12 DIAGNOSIS — N1831 Chronic kidney disease, stage 3a: Secondary | ICD-10-CM

## 2019-07-15 ENCOUNTER — Ambulatory Visit
Admission: RE | Admit: 2019-07-15 | Discharge: 2019-07-15 | Disposition: A | Discharge status: Home / Self Care | Payer: 59 | Source: Ambulatory Visit | Attending: Internal Medicine | Admitting: Internal Medicine

## 2019-07-15 ENCOUNTER — Other Ambulatory Visit: Payer: Self-pay

## 2019-07-15 DIAGNOSIS — N1831 Chronic kidney disease, stage 3a: Secondary | ICD-10-CM | POA: Diagnosis not present

## 2019-12-03 ENCOUNTER — Other Ambulatory Visit: Payer: Self-pay | Admitting: Internal Medicine

## 2019-12-03 DIAGNOSIS — R519 Headache, unspecified: Secondary | ICD-10-CM

## 2019-12-03 DIAGNOSIS — M542 Cervicalgia: Secondary | ICD-10-CM

## 2020-03-16 ENCOUNTER — Other Ambulatory Visit: Payer: Self-pay | Admitting: Obstetrics and Gynecology

## 2020-03-16 DIAGNOSIS — Z1231 Encounter for screening mammogram for malignant neoplasm of breast: Secondary | ICD-10-CM

## 2020-05-04 ENCOUNTER — Ambulatory Visit: Payer: 59

## 2020-05-29 ENCOUNTER — Other Ambulatory Visit: Payer: Self-pay | Admitting: Obstetrics and Gynecology

## 2020-05-29 DIAGNOSIS — E2839 Other primary ovarian failure: Secondary | ICD-10-CM

## 2020-06-09 ENCOUNTER — Telehealth: Payer: Self-pay | Admitting: Hematology and Oncology

## 2020-06-09 ENCOUNTER — Other Ambulatory Visit: Payer: Self-pay | Admitting: Internal Medicine

## 2020-06-09 DIAGNOSIS — R1909 Other intra-abdominal and pelvic swelling, mass and lump: Secondary | ICD-10-CM

## 2020-06-09 NOTE — Telephone Encounter (Signed)
Scheduled per 3/10 staff msg. Call pt an left a msg

## 2020-06-14 ENCOUNTER — Ambulatory Visit: Payer: 59 | Admitting: Hematology and Oncology

## 2020-06-16 ENCOUNTER — Ambulatory Visit: Payer: 59

## 2020-06-19 ENCOUNTER — Telehealth: Payer: Self-pay | Admitting: Hematology and Oncology

## 2020-06-19 NOTE — Telephone Encounter (Signed)
R/s 3/23 per patient request. Called and spoke with patient. Confirmed new date and time

## 2020-06-21 ENCOUNTER — Inpatient Hospital Stay: Payer: 59 | Admitting: Hematology and Oncology

## 2020-07-04 ENCOUNTER — Ambulatory Visit: Payer: 59

## 2020-07-10 NOTE — Progress Notes (Signed)
Patient Care Team: Gladstone Lighter, MD as PCP - General (Internal Medicine)  DIAGNOSIS:    ICD-10-CM   1. Malignant neoplasm of lower-outer quadrant of right breast of female, estrogen receptor positive ([place])  C50.511    Z17.0     SUMMARY OF ONCOLOGIC HISTORY: Oncology History  Breast cancer of lower-outer quadrant of right female breast ([place])  11/05/2004 Surgery   Right mastectomy:stage II multifocal invasive ductal carcinoma  with reconstruction   12/06/2004 - 03/15/2005 Chemotherapy    adjuvant chemotherapy with Taxotere, Adriamycin, Cytoxan 6 cycles   04/02/2005 -  Anti-estrogen oral therapy   Tamoxifen 2 years switched to letrozole in January 2010     CHIEF COMPLIANT: Follow-up of right breast cancer on letrozole therapy  INTERVAL HISTORY: Ashley Mann is a 60 y.o. with above-mentioned history of right breast cancer treated with mastectomy and reconstruction, adjuvant chemotherapy, and is currently on antiestrogen therapy since 2007 with letrozole. I last saw her 3 years ago. She presents to the clinic today for follow-up.   ALLERGIES:  has No Known Allergies.  MEDICATIONS:  Current Outpatient Medications  Medication Sig Dispense Refill  . Ascorbic Acid 1000 MG CHEW Chew 1 each by mouth.    Marland Kitchen atorvastatin (LIPITOR) 10 MG tablet Take 10 mg by mouth daily.    . cholecalciferol (VITAMIN D) 1000 UNITS tablet Take 1,000 Units by mouth daily.    Marland Kitchen ibuprofen (ADVIL,MOTRIN) 600 MG tablet Take 1 tablet (600 mg total) by mouth every 8 (eight) hours as needed for moderate pain. 30 tablet 0  . letrozole (FEMARA) 2.5 MG tablet TAKE 1 TABLET DAILY 90 tablet 3  . losartan (COZAAR) 50 MG tablet Take 50 mg by mouth daily.    . Multiple Vitamin (MULTIVITAMIN) tablet Take 1 tablet by mouth daily.    . ranitidine (ZANTAC) 150 MG tablet Take 150 mg by mouth at bedtime.    . sertraline (ZOLOFT) 100 MG tablet Take 150 mg by mouth daily.     No current facility-administered medications  for this visit.    PHYSICAL EXAMINATION: ECOG PERFORMANCE STATUS: 1 - Symptomatic but completely ambulatory  Vitals:   07/11/20 1538  BP: (!) 160/80  Pulse: 84  Resp: 15  Temp: (!) 97.4 F (36.3 C)  SpO2: 100%   Filed Weights   07/11/20 1538  Weight: 197 lb 11.2 oz (89.7 kg)    BREAST: No palpable masses or nodules in either right or left breasts. No palpable axillary supraclavicular or infraclavicular adenopathy no breast tenderness or nipple discharge. (exam performed in the presence of a chaperone)  LABORATORY DATA:  I have reviewed the data as listed CMP Latest Ref Rng & Units 05/15/2017 05/07/2016 05/08/2015  Glucose 70 - 140 mg/dL 85 86 73  BUN 7 - 26 mg/dL 18 17.9 12.2  Creatinine 0.60 - 1.10 mg/dL 1.11(H) 1.1 1.2(H)  Sodium 136 - 145 mmol/L 138 139 139  Potassium 3.5 - 5.1 mmol/L 4.1 4.9 3.7  Chloride 98 - 109 mmol/L 102 - -  CO2 22 - 29 mmol/L 25 27 26   Calcium 8.4 - 10.4 mg/dL 9.0 9.6 9.3  Total Protein 6.4 - 8.3 g/dL 7.2 7.3 7.0  Total Bilirubin 0.2 - 1.2 mg/dL 0.9 0.80 0.70  Alkaline Phos 40 - 150 U/L 62 66 69  AST 5 - 34 U/L 23 19 19   ALT 0 - 55 U/L 25 20 20     Lab Results  Component Value Date   WBC 4.8 05/15/2017  HGB 13.5 05/15/2017   HCT 40.3 05/15/2017   MCV 93.9 05/15/2017   PLT 166 05/15/2017   NEUTROABS 2.8 05/15/2017    ASSESSMENT & PLAN:  Breast cancer of lower-outer quadrant of right female breast ([place]) 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  Patient is accepting of that.  Breast cancer surveillance: 1. Mammogram scheduled for 07/13/2020  2. Breast exam 07/11/2020: Benign,  mole that needs to be removed.  She is going to contact a dermatologist.  She has a mammogram coming up. 3.Bone density test April 2017: T score -0.4 normal  Long gap in follow-up because patient lost insurance. Return to clinic in 1 year for follow-up   No orders of the defined types were placed in this encounter.  The patient has a good understanding of the overall plan. she agrees with it. she will call with any problems that may develop before the next visit here.  Total time spent: 20 mins including face to face time and time spent for planning, charting and coordination of care  Rulon Eisenmenger, MD, MPH 07/11/2020  I, Molly Dorshimer, am acting as scribe for Dr. Nicholas Lose.  I have reviewed the above documentation for accuracy and completeness, and I agree with the above.

## 2020-07-11 ENCOUNTER — Other Ambulatory Visit: Payer: Self-pay

## 2020-07-11 ENCOUNTER — Inpatient Hospital Stay: Payer: 59 | Attending: Hematology and Oncology | Admitting: Hematology and Oncology

## 2020-07-11 DIAGNOSIS — Z170 Estrogen receptor positive status [ER+]: Secondary | ICD-10-CM | POA: Insufficient documentation

## 2020-07-11 DIAGNOSIS — Z9011 Acquired absence of right breast and nipple: Secondary | ICD-10-CM | POA: Diagnosis not present

## 2020-07-11 DIAGNOSIS — C50511 Malignant neoplasm of lower-outer quadrant of right female breast: Secondary | ICD-10-CM | POA: Diagnosis present

## 2020-07-11 DIAGNOSIS — Z79811 Long term (current) use of aromatase inhibitors: Secondary | ICD-10-CM | POA: Diagnosis not present

## 2020-07-11 MED ORDER — PANTOPRAZOLE SODIUM 40 MG PO TBEC: 40.0000 mg | DELAYED_RELEASE_TABLET | Freq: Every day | ORAL | Status: DC

## 2020-07-11 MED ORDER — DULOXETINE HCL 60 MG PO CPEP: 60.0000 mg | ORAL_CAPSULE | Freq: Every day | ORAL | 3 refills | Status: DC

## 2020-07-11 MED ORDER — ATORVASTATIN CALCIUM 20 MG PO TABS: 20.0000 mg | ORAL_TABLET | Freq: Every day | ORAL | Status: AC

## 2020-07-11 MED ORDER — BIOTIN 5000 MCG PO TABS: 1.0000 | ORAL_TABLET | Freq: Every day | ORAL | Status: DC

## 2020-07-11 MED ORDER — B-12 1000 MCG PO CAPS: 1.0000 | ORAL_CAPSULE | Freq: Every day | ORAL | Status: AC

## 2020-07-11 MED ORDER — VITAMIN D 50 MCG (2000 UT) PO CAPS: 1.0000 | ORAL_CAPSULE | Freq: Every day | ORAL | Status: AC

## 2020-07-11 NOTE — Assessment & Plan Note (Signed)
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 Mammogram scheduled for 07/13/2020  2. Breast exam 07/11/2020: Benign  3.Bone density test April 2017: T score -0.4 normal  Abdominal pain: I suspect this could be neurogenic in nature Chronic dry cough: Patient is seeing pulmonary.    Chest wall pain  Long gap in follow-up because patient lost insurance.

## 2020-07-13 ENCOUNTER — Other Ambulatory Visit: Payer: Self-pay | Admitting: *Deleted

## 2020-07-13 ENCOUNTER — Other Ambulatory Visit: Payer: Self-pay

## 2020-07-13 ENCOUNTER — Ambulatory Visit
Admission: RE | Admit: 2020-07-13 | Discharge: 2020-07-13 | Disposition: A | Discharge status: Home / Self Care | Payer: 59 | Source: Ambulatory Visit | Attending: Obstetrics and Gynecology | Admitting: Obstetrics and Gynecology

## 2020-07-13 ENCOUNTER — Ambulatory Visit: Payer: 59

## 2020-07-13 DIAGNOSIS — Z1231 Encounter for screening mammogram for malignant neoplasm of breast: Secondary | ICD-10-CM

## 2020-07-13 DIAGNOSIS — C50511 Malignant neoplasm of lower-outer quadrant of right female breast: Secondary | ICD-10-CM

## 2020-07-13 NOTE — Progress Notes (Signed)
Received call from [facility] stating pt needing orders for diagnostic mammogram of left breast and not a screening mammogram due to suspicious mole on left breast. MD notified and orders received for pt to undergo diagnostic left breast mammogram.  Orders placed.

## 2020-07-17 ENCOUNTER — Other Ambulatory Visit: Payer: Self-pay | Admitting: Hematology and Oncology

## 2020-07-17 DIAGNOSIS — N649 Disorder of breast, unspecified: Secondary | ICD-10-CM

## 2020-07-18 ENCOUNTER — Telehealth: Payer: Self-pay

## 2020-07-18 NOTE — Telephone Encounter (Signed)
Patient scheduled for diagnostic mammogram of left breast for 5/3 @ 10:10 with arrival of 9:50.  Pt aware of appointment time and date.

## 2020-07-28 ENCOUNTER — Ambulatory Visit
Admission: RE | Admit: 2020-07-28 | Discharge: 2020-07-28 | Disposition: A | Discharge status: Home / Self Care | Payer: 59 | Source: Ambulatory Visit | Attending: Internal Medicine | Admitting: Internal Medicine

## 2020-07-28 ENCOUNTER — Other Ambulatory Visit: Payer: Self-pay | Admitting: Internal Medicine

## 2020-07-28 DIAGNOSIS — R519 Headache, unspecified: Secondary | ICD-10-CM | POA: Insufficient documentation

## 2020-07-28 DIAGNOSIS — R1084 Generalized abdominal pain: Secondary | ICD-10-CM

## 2020-07-28 DIAGNOSIS — M542 Cervicalgia: Secondary | ICD-10-CM

## 2020-07-28 MED ORDER — IOHEXOL 300 MG/ML  SOLN
100.0000 mL | Freq: Once | INTRAMUSCULAR | Status: AC | PRN
  Administered 2020-07-28: 100 mL via INTRAVENOUS

## 2020-08-01 ENCOUNTER — Ambulatory Visit
Admission: RE | Admit: 2020-08-01 | Discharge: 2020-08-01 | Disposition: A | Discharge status: Home / Self Care | Payer: 59 | Source: Ambulatory Visit | Attending: Hematology and Oncology | Admitting: Hematology and Oncology

## 2020-08-01 ENCOUNTER — Other Ambulatory Visit: Payer: Self-pay | Admitting: Hematology and Oncology

## 2020-08-01 ENCOUNTER — Ambulatory Visit
Admission: RE | Admit: 2020-08-01 | Discharge: 2020-08-01 | Disposition: A | Discharge status: Home / Self Care | Payer: 59 | Source: Ambulatory Visit | Attending: Internal Medicine | Admitting: Internal Medicine

## 2020-08-01 ENCOUNTER — Other Ambulatory Visit: Payer: Self-pay

## 2020-08-01 DIAGNOSIS — R1909 Other intra-abdominal and pelvic swelling, mass and lump: Secondary | ICD-10-CM | POA: Insufficient documentation

## 2020-08-01 DIAGNOSIS — R921 Mammographic calcification found on diagnostic imaging of breast: Secondary | ICD-10-CM

## 2020-08-01 DIAGNOSIS — N649 Disorder of breast, unspecified: Secondary | ICD-10-CM

## 2020-08-04 ENCOUNTER — Other Ambulatory Visit: Payer: Self-pay

## 2020-08-04 ENCOUNTER — Ambulatory Visit
Admission: RE | Admit: 2020-08-04 | Discharge: 2020-08-04 | Disposition: A | Discharge status: Home / Self Care | Payer: 59 | Source: Ambulatory Visit | Attending: Hematology and Oncology | Admitting: Hematology and Oncology

## 2020-08-04 DIAGNOSIS — R921 Mammographic calcification found on diagnostic imaging of breast: Secondary | ICD-10-CM

## 2020-08-24 ENCOUNTER — Other Ambulatory Visit: Payer: Self-pay | Admitting: Nephrology

## 2020-08-30 ENCOUNTER — Other Ambulatory Visit: Payer: Self-pay | Admitting: Nephrology

## 2020-08-30 DIAGNOSIS — N1831 Chronic kidney disease, stage 3a: Secondary | ICD-10-CM

## 2020-08-30 DIAGNOSIS — I10 Essential (primary) hypertension: Secondary | ICD-10-CM

## 2020-09-13 ENCOUNTER — Encounter: Payer: Self-pay | Admitting: Hematology and Oncology

## 2020-09-14 ENCOUNTER — Other Ambulatory Visit: Payer: Self-pay | Admitting: Nephrology

## 2020-09-14 DIAGNOSIS — N1831 Chronic kidney disease, stage 3a: Secondary | ICD-10-CM

## 2020-09-14 DIAGNOSIS — I129 Hypertensive chronic kidney disease with stage 1 through stage 4 chronic kidney disease, or unspecified chronic kidney disease: Secondary | ICD-10-CM

## 2020-09-28 ENCOUNTER — Other Ambulatory Visit: Payer: 59

## 2020-10-10 ENCOUNTER — Ambulatory Visit: Payer: 59

## 2020-10-10 ENCOUNTER — Ambulatory Visit
Admission: RE | Admit: 2020-10-10 | Discharge: 2020-10-10 | Disposition: A | Discharge status: Home / Self Care | Payer: 59 | Source: Ambulatory Visit | Attending: Nephrology | Admitting: Nephrology

## 2020-10-10 ENCOUNTER — Other Ambulatory Visit: Payer: Self-pay

## 2020-10-10 DIAGNOSIS — N1831 Chronic kidney disease, stage 3a: Secondary | ICD-10-CM | POA: Insufficient documentation

## 2020-10-10 DIAGNOSIS — I10 Essential (primary) hypertension: Secondary | ICD-10-CM | POA: Diagnosis present

## 2020-11-13 ENCOUNTER — Other Ambulatory Visit: Payer: 59

## 2020-11-24 ENCOUNTER — Ambulatory Visit
Admission: RE | Admit: 2020-11-24 | Discharge: 2020-11-24 | Disposition: A | Discharge status: Home / Self Care | Payer: 59 | Source: Ambulatory Visit | Attending: Obstetrics and Gynecology | Admitting: Obstetrics and Gynecology

## 2020-11-24 ENCOUNTER — Other Ambulatory Visit: Payer: Self-pay

## 2020-11-24 DIAGNOSIS — E2839 Other primary ovarian failure: Secondary | ICD-10-CM

## 2021-02-05 ENCOUNTER — Other Ambulatory Visit: Payer: Self-pay | Admitting: Internal Medicine

## 2021-02-05 DIAGNOSIS — N644 Mastodynia: Secondary | ICD-10-CM

## 2021-03-20 ENCOUNTER — Other Ambulatory Visit: Payer: Self-pay | Admitting: Internal Medicine

## 2021-03-21 ENCOUNTER — Other Ambulatory Visit: Payer: Self-pay | Admitting: Internal Medicine

## 2021-03-21 DIAGNOSIS — N644 Mastodynia: Secondary | ICD-10-CM

## 2021-04-20 ENCOUNTER — Ambulatory Visit
Admission: RE | Admit: 2021-04-20 | Discharge: 2021-04-20 | Disposition: A | Discharge status: Home / Self Care | Payer: 59 | Source: Ambulatory Visit | Attending: Internal Medicine | Admitting: Internal Medicine

## 2021-04-20 DIAGNOSIS — N644 Mastodynia: Secondary | ICD-10-CM

## 2021-04-20 DIAGNOSIS — R922 Inconclusive mammogram: Secondary | ICD-10-CM | POA: Diagnosis not present

## 2021-05-11 DIAGNOSIS — E782 Mixed hyperlipidemia: Secondary | ICD-10-CM | POA: Diagnosis not present

## 2021-05-11 DIAGNOSIS — I10 Essential (primary) hypertension: Secondary | ICD-10-CM | POA: Diagnosis not present

## 2021-05-11 DIAGNOSIS — N1831 Chronic kidney disease, stage 3a: Secondary | ICD-10-CM | POA: Diagnosis not present

## 2021-05-11 DIAGNOSIS — M792 Neuralgia and neuritis, unspecified: Secondary | ICD-10-CM | POA: Diagnosis not present

## 2021-05-18 DIAGNOSIS — L918 Other hypertrophic disorders of the skin: Secondary | ICD-10-CM | POA: Diagnosis not present

## 2021-05-21 DIAGNOSIS — N952 Postmenopausal atrophic vaginitis: Secondary | ICD-10-CM | POA: Diagnosis not present

## 2021-05-21 DIAGNOSIS — R159 Full incontinence of feces: Secondary | ICD-10-CM | POA: Diagnosis not present

## 2021-05-21 DIAGNOSIS — Z01419 Encounter for gynecological examination (general) (routine) without abnormal findings: Secondary | ICD-10-CM | POA: Diagnosis not present

## 2021-06-20 ENCOUNTER — Telehealth: Payer: Self-pay | Admitting: Hematology and Oncology

## 2021-06-20 NOTE — Telephone Encounter (Signed)
Rescheduled appointment per providers template. Left message.  ? ?

## 2021-07-09 NOTE — Progress Notes (Signed)
? ?Patient Care Team: ?Gladstone Lighter, MD as PCP - General (Internal Medicine) ? ?DIAGNOSIS:  ?Encounter Diagnoses  ?Name Primary?  ? Malignant neoplasm of lower-outer quadrant of right breast of female, estrogen receptor positive ([place])   ? Smoker Yes  ? Ex-smoker   ? ? ?SUMMARY OF ONCOLOGIC HISTORY: ?Oncology History  ?Breast cancer of lower-outer quadrant of right female breast [facility])  ?11/05/2004 Surgery  ? Right mastectomy:stage II multifocal invasive ductal carcinoma  with reconstruction ? ?  ?12/06/2004 - 03/15/2005 Chemotherapy  ?  adjuvant chemotherapy with Taxotere, Adriamycin, Cytoxan ?6 cycles ? ?  ?04/02/2005 -  Anti-estrogen oral therapy  ? Tamoxifen ?2 years switched to letrozole in January 2010 ?  ? ? ?CHIEF COMPLIANT: right breast cancer  ? ?INTERVAL HISTORY: Ashley Mann is a 61 y.o. with above-mentioned history of right breast cancer. She presents to the clinic today for follow-up. She states that she still having pain in her left side and shoulder.  She is extremely worried about lung cancer or breast cancer recurrence in the lung.  She was recently in the emergency room at [facility] where she underwent CT of the abdomen and pelvis which did not reveal any evidence of metastatic disease. ? ?ALLERGIES:  has No Known Allergies. ? ?MEDICATIONS:  ?Current Outpatient Medications  ?Medication Sig Dispense Refill  ? Calcium 600-5 MG-MCG TABS Take 1 tablet by mouth daily.    ? cetirizine (ZYRTEC ALLERGY) 10 MG tablet Take 1 tablet (10 mg total) by mouth daily.    ? sertraline (ZOLOFT) 100 MG tablet Take 1 tablet (100 mg total) by mouth daily.    ? Ascorbic Acid 1000 MG CHEW Chew 1 each by mouth.    ? atorvastatin (LIPITOR) 20 MG tablet Take 1 tablet (20 mg total) by mouth daily.    ? Biotin 5000 MCG TABS Take 1 tablet by mouth daily. 30 tablet   ? Cholecalciferol (VITAMIN D) 50 MCG (2000 UT) CAPS Take 1 capsule (2,000 Units total) by mouth daily. 30 capsule   ? Cyanocobalamin (B-12) 1000 MCG CAPS Take 1  capsule by mouth daily.    ? ibuprofen (ADVIL,MOTRIN) 600 MG tablet Take 1 tablet (600 mg total) by mouth every 8 (eight) hours as needed for moderate pain. 30 tablet 0  ? losartan (COZAAR) 50 MG tablet Take 50 mg by mouth daily.    ? Multiple Vitamin (MULTIVITAMIN) tablet Take 1 tablet by mouth daily.    ? pantoprazole (PROTONIX) 40 MG tablet Take 1 tablet (40 mg total) by mouth 2 (two) times daily.    ? ?No current facility-administered medications for this visit.  ? ? ?PHYSICAL EXAMINATION: ?ECOG PERFORMANCE STATUS: 1 - Symptomatic but completely ambulatory ? ?Vitals:  ? 07/23/21 1523  ?BP: 139/63  ?Pulse: 71  ?Resp: 18  ?Temp: 97.9 ?F (36.6 ?C)  ?SpO2: 99%  ? ?Filed Weights  ? 07/23/21 1523  ?Weight: 210 lb (95.3 kg)  ? ?  ? ?LABORATORY DATA:  ?I have reviewed the data as listed ? ?  Latest Ref Rng & Units 05/15/2017  ?  2:28 PM 05/07/2016  ?  3:09 PM 05/08/2015  ?  3:06 PM  ?CMP  ?Glucose 70 - 140 mg/dL 85   86   73    ?BUN 7 - 26 mg/dL 18   17.9   12.2    ?Creatinine 0.60 - 1.10 mg/dL 1.11   1.1   1.2    ?Sodium 136 - 145 mmol/L 138   139  139    ?Potassium 3.5 - 5.1 mmol/L 4.1   4.9   3.7    ?Chloride 98 - 109 mmol/L 102      ?CO2 22 - 29 mmol/L '25   27   26    '$ ?Calcium 8.4 - 10.4 mg/dL 9.0   9.6   9.3    ?Total Protein 6.4 - 8.3 g/dL 7.2   7.3   7.0    ?Total Bilirubin 0.2 - 1.2 mg/dL 0.9   0.80   0.70    ?Alkaline Phos 40 - 150 U/L 62   66   69    ?AST 5 - 34 U/L '23   19   19    '$ ?ALT 0 - 55 U/L '25   20   20    '$ ? ? ?Lab Results  ?Component Value Date  ? WBC 4.8 05/15/2017  ? HGB 13.5 05/15/2017  ? HCT 40.3 05/15/2017  ? MCV 93.9 05/15/2017  ? PLT 166 05/15/2017  ? NEUTROABS 2.8 05/15/2017  ? ? 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  ?  ?Breast cancer surveillance: ?1. Mammogram : Mammogram and ultrasound left breast January 2023: Benign ?2. Breast exam 07/23/2021: Benign, tenderness ?3. Bone density test April 2017: T score -0.4 normal ?  ?07/18/2020: CT abdomen and pelvis at [facility]: Benign ?33-pack-year smoker: We will obtain a CT chest low-dose CT scan based on lung cancer surveillance protocol ?Telephone visit after the CT chest to discuss results ?Return to clinic in 1 year for follow-up ? ? ? ?Orders Placed This Encounter  ?Procedures  ? CT CHEST LUNG CA SCREEN LOW DOSE W/O CM  ?  Standing Status:   Future  ?  Standing Expiration Date:   07/24/2022  ?  Order Specific Question:   Preferred Imaging Location?  ?  Answer:   [facility]  ?  Order Specific Question:   Release to patient  ?  Answer:   Immediate  ? ?The patient has a good understanding of the overall plan. she agrees with it. she will call with any problems that may develop before the next visit here. ?Total time spent: 30 mins including face to face time and time spent for planning, charting and co-ordination of care ? ? Harriette Ohara, MD ?07/23/21 ? ? ? I Gardiner Coins am scribing for Dr. Lindi Adie ? ?I have reviewed the above documentation for accuracy and completeness, and I agree with the above. ?  ?

## 2021-07-11 ENCOUNTER — Ambulatory Visit: Payer: 59 | Admitting: Hematology and Oncology

## 2021-07-18 DIAGNOSIS — K5909 Other constipation: Secondary | ICD-10-CM | POA: Diagnosis not present

## 2021-07-18 DIAGNOSIS — N200 Calculus of kidney: Secondary | ICD-10-CM | POA: Diagnosis not present

## 2021-07-18 DIAGNOSIS — M549 Dorsalgia, unspecified: Secondary | ICD-10-CM | POA: Diagnosis not present

## 2021-07-18 DIAGNOSIS — M545 Low back pain, unspecified: Secondary | ICD-10-CM | POA: Diagnosis not present

## 2021-07-18 DIAGNOSIS — M79602 Pain in left arm: Secondary | ICD-10-CM | POA: Diagnosis not present

## 2021-07-18 DIAGNOSIS — R109 Unspecified abdominal pain: Secondary | ICD-10-CM | POA: Diagnosis not present

## 2021-07-18 DIAGNOSIS — R110 Nausea: Secondary | ICD-10-CM | POA: Diagnosis not present

## 2021-07-18 DIAGNOSIS — G8929 Other chronic pain: Secondary | ICD-10-CM | POA: Diagnosis not present

## 2021-07-18 DIAGNOSIS — R079 Chest pain, unspecified: Secondary | ICD-10-CM | POA: Diagnosis not present

## 2021-07-18 DIAGNOSIS — N183 Chronic kidney disease, stage 3 unspecified: Secondary | ICD-10-CM | POA: Diagnosis not present

## 2021-07-18 DIAGNOSIS — I129 Hypertensive chronic kidney disease with stage 1 through stage 4 chronic kidney disease, or unspecified chronic kidney disease: Secondary | ICD-10-CM | POA: Diagnosis not present

## 2021-07-18 DIAGNOSIS — R6884 Jaw pain: Secondary | ICD-10-CM | POA: Diagnosis not present

## 2021-07-18 DIAGNOSIS — K760 Fatty (change of) liver, not elsewhere classified: Secondary | ICD-10-CM | POA: Diagnosis not present

## 2021-07-18 DIAGNOSIS — R0789 Other chest pain: Secondary | ICD-10-CM | POA: Diagnosis not present

## 2021-07-23 ENCOUNTER — Inpatient Hospital Stay: Payer: 59 | Attending: Hematology and Oncology | Admitting: Hematology and Oncology

## 2021-07-23 VITALS — BP 139/63 | HR 71 | Temp 97.9°F | Resp 18 | Ht 67.0 in | Wt 210.0 lb

## 2021-07-23 DIAGNOSIS — C50511 Malignant neoplasm of lower-outer quadrant of right female breast: Secondary | ICD-10-CM

## 2021-07-23 DIAGNOSIS — Z853 Personal history of malignant neoplasm of breast: Secondary | ICD-10-CM | POA: Insufficient documentation

## 2021-07-23 DIAGNOSIS — Z170 Estrogen receptor positive status [ER+]: Secondary | ICD-10-CM | POA: Diagnosis not present

## 2021-07-23 DIAGNOSIS — Z79899 Other long term (current) drug therapy: Secondary | ICD-10-CM | POA: Insufficient documentation

## 2021-07-23 DIAGNOSIS — F17200 Nicotine dependence, unspecified, uncomplicated: Secondary | ICD-10-CM | POA: Diagnosis not present

## 2021-07-23 DIAGNOSIS — Z87891 Personal history of nicotine dependence: Secondary | ICD-10-CM | POA: Diagnosis not present

## 2021-07-23 MED ORDER — CALCIUM 600-5 MG-MCG PO TABS: 1.0000 | ORAL_TABLET | Freq: Every day | ORAL | Status: DC

## 2021-07-23 MED ORDER — PANTOPRAZOLE SODIUM 40 MG PO TBEC: 40.0000 mg | DELAYED_RELEASE_TABLET | Freq: Two times a day (BID) | ORAL | Status: AC

## 2021-07-23 MED ORDER — SERTRALINE HCL 100 MG PO TABS: 100.0000 mg | ORAL_TABLET | Freq: Every day | ORAL | Status: DC

## 2021-07-23 MED ORDER — CETIRIZINE HCL 10 MG PO TABS: 10.0000 mg | ORAL_TABLET | Freq: Every day | ORAL | Status: AC

## 2021-07-23 NOTE — Assessment & Plan Note (Signed)
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  Patient is accepting of that. ?? ?Breast cancer surveillance: ?1. Mammogram : Mammogram and ultrasound left breast January 2023: Benign ?2. Breast exam?07/23/2021: Benign, tenderness ?3.?Bone density test April 2017: T score -0.4 normal ?? ?Return to clinic in 1 year for follow-up ?

## 2021-07-27 DIAGNOSIS — Z0000 Encounter for general adult medical examination without abnormal findings: Secondary | ICD-10-CM | POA: Diagnosis not present

## 2021-07-27 DIAGNOSIS — Z780 Asymptomatic menopausal state: Secondary | ICD-10-CM | POA: Diagnosis not present

## 2021-07-27 DIAGNOSIS — C50111 Malignant neoplasm of central portion of right female breast: Secondary | ICD-10-CM | POA: Diagnosis not present

## 2021-07-27 DIAGNOSIS — R739 Hyperglycemia, unspecified: Secondary | ICD-10-CM | POA: Diagnosis not present

## 2021-07-27 DIAGNOSIS — N1831 Chronic kidney disease, stage 3a: Secondary | ICD-10-CM | POA: Diagnosis not present

## 2021-07-27 DIAGNOSIS — R399 Unspecified symptoms and signs involving the genitourinary system: Secondary | ICD-10-CM | POA: Diagnosis not present

## 2021-07-27 DIAGNOSIS — M792 Neuralgia and neuritis, unspecified: Secondary | ICD-10-CM | POA: Diagnosis not present

## 2021-07-27 DIAGNOSIS — E782 Mixed hyperlipidemia: Secondary | ICD-10-CM | POA: Diagnosis not present

## 2021-07-27 DIAGNOSIS — I10 Essential (primary) hypertension: Secondary | ICD-10-CM | POA: Diagnosis not present

## 2021-08-08 ENCOUNTER — Other Ambulatory Visit: Payer: 59

## 2021-08-12 ENCOUNTER — Ambulatory Visit (INDEPENDENT_AMBULATORY_CARE_PROVIDER_SITE_OTHER): Payer: 59

## 2021-08-12 ENCOUNTER — Other Ambulatory Visit: Payer: Self-pay

## 2021-08-12 ENCOUNTER — Encounter: Payer: Self-pay | Admitting: Emergency Medicine

## 2021-08-12 ENCOUNTER — Ambulatory Visit
Admission: EM | Admit: 2021-08-12 | Discharge: 2021-08-12 | Disposition: A | Discharge status: Home / Self Care | Payer: 59 | Attending: Physician Assistant | Admitting: Physician Assistant

## 2021-08-12 DIAGNOSIS — T07XXXA Unspecified multiple injuries, initial encounter: Secondary | ICD-10-CM

## 2021-08-12 DIAGNOSIS — M79662 Pain in left lower leg: Secondary | ICD-10-CM | POA: Diagnosis not present

## 2021-08-12 DIAGNOSIS — M79605 Pain in left leg: Secondary | ICD-10-CM

## 2021-08-12 DIAGNOSIS — M79672 Pain in left foot: Secondary | ICD-10-CM | POA: Diagnosis not present

## 2021-08-12 NOTE — ED Provider Notes (Signed)
?[place] ? ? ? ?CSN: [id number] ?Arrival date & time: 08/12/21  1402 ? ? ?  ? ?History   ?Chief Complaint ?Chief Complaint  ?Patient presents with  ? Fall  ? Leg Pain  ? Ankle Pain  ? ? ?HPI ?Ashley Mann is a 61 y.o. female presenting for pain in the left lower extremity.  Patient says that she fell 9 days ago out her mother's back door and fell onto her knees.  She says she has a bruise on the right leg but its not really hurting her.  She says she has had pain in the left leg that has not gotten any better in the past week and a half.  Reports continued bruising of the lower leg.  Has numerous yellow discoloration of the left lower leg which she thinks could be due to a fracture causing "blood to leak out of the bone."  Patient denies any numbness or tingling or weakness.  Has not had any further falls.  Has been bearing weight on the leg and has actually that her daughter move.  Has traveled to 3 states in the past week and a half.  She has been taking Tylenol for the pain.  Patient has history of varicose veins.  She is concerned about possible DVT.  No history of DVT.  No other injuries, complaints or concerns. ? ?HPI ? ?Past Medical History:  ?Diagnosis Date  ? Breast cancer, right [facility]) 2006  ? Mastectomy, chemo + rad tx's.  ? Depression   ? Genital herpes in women   ? History of unilateral modified radical mastectomy 11/21/04  ? Hyperlipidemia   ? Hypertension   ? ? ?Patient Active Problem List  ? Diagnosis Date Noted  ? Post-menopausal 05/08/2015  ? Foamy urine 03/21/2015  ? Vomiting 03/21/2015  ? Dizziness 03/21/2015  ? 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 Routine general medical examination at a health care facility 07/25/2014  ? H/O malignant neoplasm 07/25/2014  ? Essential (primary) hypertension 07/25/2014  ? Major depressive episode 07/25/2014  ? Adiposity 07/25/2014  ? Breast cancer of  lower-outer quadrant of right female breast ([place]) 07/12/2011  ? Depression 07/12/2011  ? ? ?Past Surgical History:  ?Procedure Laterality Date  ? ABDOMINAL HYSTERECTOMY    ? BREAST RECONSTRUCTION    ? BREAST SURGERY    ? rt maste  ? CESAREAN SECTION    ? CHOLECYSTECTOMY    ? MASTECTOMY Right 2006  ? mastectomy   ? MODIFIED RADICAL MASTECTOMY W/ AXILLARY LYMPH NODE DISSECTION  11/21/04  ? Right. With TRAM reconstruction.   ? OOPHORECTOMY    ? REDUCTION MAMMAPLASTY    ? reduction  ? SALPINGOOPHORECTOMY    ? ? ?OB History   ?No obstetric history on file. ?  ? ? ? ?Home Medications   ? ?Prior to Admission medications   ?Medication Sig Start Date End Date Taking? Authorizing Provider  ?atorvastatin (LIPITOR) 20 MG tablet Take 1 tablet (20 mg total) by mouth daily. 07/11/20  Yes Nicholas Lose, MD  ?Cholecalciferol (VITAMIN D) 50 MCG (2000 UT) CAPS Take 1 capsule (2,000 Units total) by mouth daily. 07/11/20  Yes Nicholas Lose, MD  ?Cyanocobalamin (B-12) 1000 MCG CAPS Take 1 capsule by mouth daily. 07/11/20  Yes Nicholas Lose, MD  ?losartan (COZAAR) 50 MG tablet Take 50 mg by mouth daily.   Yes [provider]  ?Multiple Vitamin (MULTIVITAMIN) tablet Take 1 tablet  by mouth daily.   Yes [provider]  ?pantoprazole (PROTONIX) 40 MG tablet Take 1 tablet (40 mg total) by mouth 2 (two) times daily. 07/23/21  Yes Nicholas Lose, MD  ?sertraline (ZOLOFT) 100 MG tablet Take 1 tablet (100 mg total) by mouth daily. 07/23/21  Yes Nicholas Lose, MD  ?Ascorbic Acid 1000 MG CHEW Chew 1 each by mouth.    [provider]  ?Biotin 5000 MCG TABS Take 1 tablet by mouth daily. 07/11/20   Nicholas Lose, MD  ?Calcium 600-5 MG-MCG TABS Take 1 tablet by mouth daily. 07/23/21   Nicholas Lose, MD  ?cetirizine (ZYRTEC ALLERGY) 10 MG tablet Take 1 tablet (10 mg total) by mouth daily. 07/23/21   Nicholas Lose, MD  ?ibuprofen (ADVIL,MOTRIN) 600 MG tablet Take 1 tablet (600 mg total) by mouth every 8 (eight) hours as needed for  moderate pain. 11/21/14   Jan Fireman, PA-C  ? ? ?Family History ?Family History  ?Problem Relation Age of Onset  ? Cancer Father   ?     colon  ? Cancer Paternal Aunt   ?     lung  ? Cancer Maternal Grandmother   ?     ovarian  ? ? ?Social History ?Social History  ? ?Tobacco Use  ? Smoking status: Former  ?  Packs/day: 1.00  ?  Years: 25.00  ?  Pack years: 25.00  ?  Types: Cigarettes  ? Smokeless tobacco: Never  ?Vaping Use  ? Vaping Use: Never used  ?Substance Use Topics  ? Alcohol use: No  ? Drug use: No  ? ? ? ?Allergies   ?Patient has no known allergies. ? ? ?Review of Systems ?Review of Systems  ?Musculoskeletal:  Positive for arthralgias, gait problem and joint swelling.  ?Skin:  Positive for color change. Negative for wound.  ?Neurological:  Negative for weakness and numbness.  ? ? ?Physical Exam ?Triage Vital Signs ?ED Triage Vitals  ?Enc Vitals Group  ?   BP   ?   Pulse   ?   Resp   ?   Temp   ?   Temp src   ?   SpO2   ?   Weight   ?   Height   ?   Head Circumference   ?   Peak Flow   ?   Pain Score   ?   Pain Loc   ?   Pain Edu?   ?   Excl. in [place]?   ? ?No data found. ? ?Updated Vital Signs ?BP 135/67 (BP Location: Left Arm)   Pulse 71   Temp 98.2 ?F (36.8 ?C) (Oral)   Resp 14   Ht '5\' 7"'$  (1.702 m)   Wt 204 lb (92.5 kg)   SpO2 99%   BMI 31.95 kg/m?  ?  ? ?Physical Exam ?Vitals and nursing note reviewed.  ?Constitutional:   ?   General: She is not in acute distress. ?   Appearance: Normal appearance. She is not ill-appearing or toxic-appearing.  ?HENT:  ?   Head: Normocephalic and atraumatic.  ?Eyes:  ?   General: No scleral icterus.    ?   Right eye: No discharge.     ?   Left eye: No discharge.  ?   Conjunctiva/sclera: Conjunctivae normal.  ?Cardiovascular:  ?   Rate and Rhythm: Normal rate.  ?   Pulses: Normal pulses.  ?Pulmonary:  ?   Effort: Pulmonary effort is normal. No respiratory  distress.  ?Musculoskeletal:  ?   Cervical back: Neck supple.  ?   Left knee: Normal.  ?   Left lower leg:  Tenderness (TTP mid tibia) present.  ?   Left ankle: Swelling (mild swelling laterally) present. Tenderness present over the lateral malleolus, medial malleolus and ATF ligament. Normal range of motion. Normal pulse.  ?   Left Achilles Tendon: Normal.  ?   Left foot: Normal range of motion. Tenderness (lateral foot) present. No swelling. Normal pulse.  ?   Comments: Multiple contusions distal to the knee all the way to the ankle.  Bluish discoloration.  There is yellowish discoloration to the skin of the ankle.  Appears to be healing contusion.  Additionally, patient has multiple varicose veins.  She has no calf tenderness and no erythema, warmth or swelling of calf.  ?Skin: ?   General: Skin is dry.  ?Neurological:  ?   General: No focal deficit present.  ?   Mental Status: She is alert. Mental status is at baseline.  ?   Motor: No weakness.  ?   Gait: Gait abnormal.  ?Psychiatric:     ?   Mood and Affect: Mood normal.     ?   Behavior: Behavior normal.     ?   Thought Content: Thought content normal.  ? ? ? ?UC Treatments / Results  ?Labs ?(all labs ordered are listed, but only abnormal results are displayed) ?Labs Reviewed - No data to display ? ?EKG ? ? ?Radiology ?DG Tibia/Fibula Left ? ?Result Date: 08/12/2021 ?CLINICAL DATA:  fell, left lower leg pain EXAM: LEFT FOOT - COMPLETE 3+ VIEW; LEFT TIBIA AND FIBULA - 2 VIEW COMPARISON:  None Available. FINDINGS: No acute fracture or dislocation. Midfoot degenerative changes. Enthesopathic changes of the Achilles tendon. No area of erosion or osseous destruction. No unexpected radiopaque foreign body. Soft tissues are unremarkable. IMPRESSION: No acute fracture or dislocation. If persistent concern for Lisfranc injury, recommend dedicated bilateral weight-bearing views. Electronically Signed   By: Valentino Saxon M.D.   On: 08/12/2021 14:49  ? ?DG Foot Complete Left ? ?Result Date: 08/12/2021 ?CLINICAL DATA:  fell, left lower leg pain EXAM: LEFT FOOT - COMPLETE 3+  VIEW; LEFT TIBIA AND FIBULA - 2 VIEW COMPARISON:  None Available. FINDINGS: No acute fracture or dislocation. Midfoot degenerative changes. Enthesopathic changes of the Achilles tendon. No area of erosion or osseous destruction. N

## 2021-08-12 NOTE — ED Triage Notes (Signed)
Patient states that she fell out of her mom's back door on 08/03/21.  Patient c/o pain in her left ankle, left foot, and left lower leg.   ?

## 2021-08-12 NOTE — Discharge Instructions (Addendum)
LOWER EXT PAIN: X-rays are negative for fractures. Stressed avoiding painful activities . Reviewed RICE guidelines. Use medications as directed, including NSAIDs. If no NSAIDs have been prescribed for you today, you may use topical Voltaren gel (over the counter). If no improvement in the next 1-2 weeks, f/u with PCP or orthopedics ([facility]) for reexamination, and please feel free to call or return at any time for any questions or concerns you may have and we will be happy to help you!     ?

## 2021-08-17 DIAGNOSIS — R946 Abnormal results of thyroid function studies: Secondary | ICD-10-CM | POA: Diagnosis not present

## 2021-08-23 DIAGNOSIS — C50911 Malignant neoplasm of unspecified site of right female breast: Secondary | ICD-10-CM | POA: Diagnosis not present

## 2021-08-23 DIAGNOSIS — E538 Deficiency of other specified B group vitamins: Secondary | ICD-10-CM | POA: Diagnosis not present

## 2021-08-23 DIAGNOSIS — E785 Hyperlipidemia, unspecified: Secondary | ICD-10-CM | POA: Diagnosis not present

## 2021-08-23 DIAGNOSIS — N1831 Chronic kidney disease, stage 3a: Secondary | ICD-10-CM | POA: Diagnosis not present

## 2021-08-23 DIAGNOSIS — I129 Hypertensive chronic kidney disease with stage 1 through stage 4 chronic kidney disease, or unspecified chronic kidney disease: Secondary | ICD-10-CM | POA: Diagnosis not present

## 2021-10-16 ENCOUNTER — Telehealth: Payer: Self-pay

## 2021-10-16 NOTE — Telephone Encounter (Signed)
Attempted to call pt to make her aware of appt for CT chest lung screen. She is scheduled at [facility] for 10/24/21 at 1 PM with a 1245 arrival. LVM for her to call back.

## 2021-10-16 NOTE — Telephone Encounter (Signed)
Pt returned call and was advised of appt we made for her at [facility]. Pt states she prefers a later appt. Pt was provided with phone number to [facility] to call and r/s appt for when she is available.

## 2021-10-24 ENCOUNTER — Ambulatory Visit
Admission: RE | Admit: 2021-10-24 | Discharge: 2021-10-24 | Disposition: A | Discharge status: Home / Self Care | Payer: 59 | Source: Ambulatory Visit | Attending: Hematology and Oncology | Admitting: Hematology and Oncology

## 2021-10-24 DIAGNOSIS — Z853 Personal history of malignant neoplasm of breast: Secondary | ICD-10-CM | POA: Diagnosis not present

## 2021-10-24 DIAGNOSIS — Z87891 Personal history of nicotine dependence: Secondary | ICD-10-CM | POA: Diagnosis not present

## 2021-10-24 DIAGNOSIS — J984 Other disorders of lung: Secondary | ICD-10-CM | POA: Diagnosis not present

## 2021-10-24 DIAGNOSIS — N2889 Other specified disorders of kidney and ureter: Secondary | ICD-10-CM | POA: Diagnosis not present

## 2021-10-29 DIAGNOSIS — E669 Obesity, unspecified: Secondary | ICD-10-CM | POA: Diagnosis not present

## 2021-10-29 DIAGNOSIS — D235 Other benign neoplasm of skin of trunk: Secondary | ICD-10-CM | POA: Diagnosis not present

## 2021-10-29 DIAGNOSIS — E782 Mixed hyperlipidemia: Secondary | ICD-10-CM | POA: Diagnosis not present

## 2021-10-29 DIAGNOSIS — I351 Nonrheumatic aortic (valve) insufficiency: Secondary | ICD-10-CM | POA: Diagnosis not present

## 2021-10-29 DIAGNOSIS — I10 Essential (primary) hypertension: Secondary | ICD-10-CM | POA: Diagnosis not present

## 2021-10-29 DIAGNOSIS — I340 Nonrheumatic mitral (valve) insufficiency: Secondary | ICD-10-CM | POA: Diagnosis not present

## 2021-10-29 DIAGNOSIS — R55 Syncope and collapse: Secondary | ICD-10-CM | POA: Diagnosis not present

## 2021-10-29 DIAGNOSIS — D485 Neoplasm of uncertain behavior of skin: Secondary | ICD-10-CM | POA: Diagnosis not present

## 2021-10-29 DIAGNOSIS — R0602 Shortness of breath: Secondary | ICD-10-CM | POA: Diagnosis not present

## 2021-11-01 ENCOUNTER — Telehealth: Payer: Self-pay | Admitting: Hematology and Oncology

## 2021-11-01 ENCOUNTER — Encounter: Payer: Self-pay | Admitting: *Deleted

## 2021-11-01 NOTE — Telephone Encounter (Signed)
I discussed the results of the CT chest for lung assessment given her 33-pack-year smoking history.  There is a 3.6 mm nodule which is nonspecific.  Hepatic steatosis and cyst on the liver were also noted. I encouraged her to exercise and stay active. We will repeat another CT scan in 1 year.

## 2021-11-02 DIAGNOSIS — I10 Essential (primary) hypertension: Secondary | ICD-10-CM | POA: Diagnosis not present

## 2021-11-02 DIAGNOSIS — I351 Nonrheumatic aortic (valve) insufficiency: Secondary | ICD-10-CM | POA: Diagnosis not present

## 2021-11-02 DIAGNOSIS — G459 Transient cerebral ischemic attack, unspecified: Secondary | ICD-10-CM | POA: Diagnosis not present

## 2021-11-02 DIAGNOSIS — I4891 Unspecified atrial fibrillation: Secondary | ICD-10-CM | POA: Diagnosis not present

## 2021-11-02 DIAGNOSIS — R0602 Shortness of breath: Secondary | ICD-10-CM | POA: Diagnosis not present

## 2021-11-02 DIAGNOSIS — E669 Obesity, unspecified: Secondary | ICD-10-CM | POA: Diagnosis not present

## 2021-11-02 DIAGNOSIS — E782 Mixed hyperlipidemia: Secondary | ICD-10-CM | POA: Diagnosis not present

## 2021-11-02 DIAGNOSIS — R55 Syncope and collapse: Secondary | ICD-10-CM | POA: Diagnosis not present

## 2021-11-02 DIAGNOSIS — I340 Nonrheumatic mitral (valve) insufficiency: Secondary | ICD-10-CM | POA: Diagnosis not present

## 2021-11-13 DIAGNOSIS — I3489 Other nonrheumatic mitral valve disorders: Secondary | ICD-10-CM | POA: Diagnosis not present

## 2021-11-16 DIAGNOSIS — I340 Nonrheumatic mitral (valve) insufficiency: Secondary | ICD-10-CM | POA: Diagnosis not present

## 2021-11-16 DIAGNOSIS — G459 Transient cerebral ischemic attack, unspecified: Secondary | ICD-10-CM | POA: Diagnosis not present

## 2021-11-16 DIAGNOSIS — I4891 Unspecified atrial fibrillation: Secondary | ICD-10-CM | POA: Diagnosis not present

## 2021-11-16 DIAGNOSIS — I351 Nonrheumatic aortic (valve) insufficiency: Secondary | ICD-10-CM | POA: Diagnosis not present

## 2021-11-16 DIAGNOSIS — I10 Essential (primary) hypertension: Secondary | ICD-10-CM | POA: Diagnosis not present

## 2021-11-16 DIAGNOSIS — E669 Obesity, unspecified: Secondary | ICD-10-CM | POA: Diagnosis not present

## 2021-11-16 DIAGNOSIS — E782 Mixed hyperlipidemia: Secondary | ICD-10-CM | POA: Diagnosis not present

## 2021-11-16 DIAGNOSIS — R55 Syncope and collapse: Secondary | ICD-10-CM | POA: Diagnosis not present

## 2021-11-26 DIAGNOSIS — R55 Syncope and collapse: Secondary | ICD-10-CM | POA: Diagnosis not present

## 2021-11-26 DIAGNOSIS — E669 Obesity, unspecified: Secondary | ICD-10-CM | POA: Diagnosis not present

## 2021-11-26 DIAGNOSIS — I4891 Unspecified atrial fibrillation: Secondary | ICD-10-CM | POA: Diagnosis not present

## 2021-11-26 DIAGNOSIS — G459 Transient cerebral ischemic attack, unspecified: Secondary | ICD-10-CM | POA: Diagnosis not present

## 2021-11-26 DIAGNOSIS — I340 Nonrheumatic mitral (valve) insufficiency: Secondary | ICD-10-CM | POA: Diagnosis not present

## 2021-11-26 DIAGNOSIS — I351 Nonrheumatic aortic (valve) insufficiency: Secondary | ICD-10-CM | POA: Diagnosis not present

## 2021-11-26 DIAGNOSIS — I10 Essential (primary) hypertension: Secondary | ICD-10-CM | POA: Diagnosis not present

## 2021-11-26 DIAGNOSIS — E782 Mixed hyperlipidemia: Secondary | ICD-10-CM | POA: Diagnosis not present

## 2021-12-11 DIAGNOSIS — I340 Nonrheumatic mitral (valve) insufficiency: Secondary | ICD-10-CM | POA: Diagnosis not present

## 2021-12-11 DIAGNOSIS — I10 Essential (primary) hypertension: Secondary | ICD-10-CM | POA: Diagnosis not present

## 2021-12-11 DIAGNOSIS — E669 Obesity, unspecified: Secondary | ICD-10-CM | POA: Diagnosis not present

## 2021-12-11 DIAGNOSIS — E782 Mixed hyperlipidemia: Secondary | ICD-10-CM | POA: Diagnosis not present

## 2021-12-11 DIAGNOSIS — I4891 Unspecified atrial fibrillation: Secondary | ICD-10-CM | POA: Diagnosis not present

## 2021-12-11 DIAGNOSIS — I351 Nonrheumatic aortic (valve) insufficiency: Secondary | ICD-10-CM | POA: Diagnosis not present

## 2022-01-15 DIAGNOSIS — E782 Mixed hyperlipidemia: Secondary | ICD-10-CM | POA: Diagnosis not present

## 2022-01-15 DIAGNOSIS — I10 Essential (primary) hypertension: Secondary | ICD-10-CM | POA: Diagnosis not present

## 2022-01-15 DIAGNOSIS — E669 Obesity, unspecified: Secondary | ICD-10-CM | POA: Diagnosis not present

## 2022-01-15 DIAGNOSIS — I340 Nonrheumatic mitral (valve) insufficiency: Secondary | ICD-10-CM | POA: Diagnosis not present

## 2022-01-15 DIAGNOSIS — I4891 Unspecified atrial fibrillation: Secondary | ICD-10-CM | POA: Diagnosis not present

## 2022-01-15 DIAGNOSIS — I351 Nonrheumatic aortic (valve) insufficiency: Secondary | ICD-10-CM | POA: Diagnosis not present

## 2022-01-29 DIAGNOSIS — R1032 Left lower quadrant pain: Secondary | ICD-10-CM | POA: Diagnosis not present

## 2022-01-29 DIAGNOSIS — E782 Mixed hyperlipidemia: Secondary | ICD-10-CM | POA: Diagnosis not present

## 2022-01-29 DIAGNOSIS — I10 Essential (primary) hypertension: Secondary | ICD-10-CM | POA: Diagnosis not present

## 2022-01-29 DIAGNOSIS — R195 Other fecal abnormalities: Secondary | ICD-10-CM | POA: Diagnosis not present

## 2022-01-29 DIAGNOSIS — R109 Unspecified abdominal pain: Secondary | ICD-10-CM | POA: Diagnosis not present

## 2022-01-29 DIAGNOSIS — R300 Dysuria: Secondary | ICD-10-CM | POA: Diagnosis not present

## 2022-01-29 DIAGNOSIS — N1831 Chronic kidney disease, stage 3a: Secondary | ICD-10-CM | POA: Diagnosis not present

## 2022-01-31 DIAGNOSIS — I10 Essential (primary) hypertension: Secondary | ICD-10-CM | POA: Diagnosis not present

## 2022-01-31 DIAGNOSIS — E669 Obesity, unspecified: Secondary | ICD-10-CM | POA: Diagnosis not present

## 2022-01-31 DIAGNOSIS — I4891 Unspecified atrial fibrillation: Secondary | ICD-10-CM | POA: Diagnosis not present

## 2022-01-31 DIAGNOSIS — I340 Nonrheumatic mitral (valve) insufficiency: Secondary | ICD-10-CM | POA: Diagnosis not present

## 2022-01-31 DIAGNOSIS — I351 Nonrheumatic aortic (valve) insufficiency: Secondary | ICD-10-CM | POA: Diagnosis not present

## 2022-01-31 DIAGNOSIS — E782 Mixed hyperlipidemia: Secondary | ICD-10-CM | POA: Diagnosis not present

## 2022-02-13 DIAGNOSIS — M1990 Unspecified osteoarthritis, unspecified site: Secondary | ICD-10-CM | POA: Diagnosis not present

## 2022-02-13 DIAGNOSIS — Z809 Family history of malignant neoplasm, unspecified: Secondary | ICD-10-CM | POA: Diagnosis not present

## 2022-02-13 DIAGNOSIS — E1151 Type 2 diabetes mellitus with diabetic peripheral angiopathy without gangrene: Secondary | ICD-10-CM | POA: Diagnosis not present

## 2022-02-13 DIAGNOSIS — I110 Hypertensive heart disease with heart failure: Secondary | ICD-10-CM | POA: Diagnosis not present

## 2022-02-13 DIAGNOSIS — I4891 Unspecified atrial fibrillation: Secondary | ICD-10-CM | POA: Diagnosis not present

## 2022-02-13 DIAGNOSIS — E785 Hyperlipidemia, unspecified: Secondary | ICD-10-CM | POA: Diagnosis not present

## 2022-02-13 DIAGNOSIS — Z833 Family history of diabetes mellitus: Secondary | ICD-10-CM | POA: Diagnosis not present

## 2022-02-13 DIAGNOSIS — E1142 Type 2 diabetes mellitus with diabetic polyneuropathy: Secondary | ICD-10-CM | POA: Diagnosis not present

## 2022-02-13 DIAGNOSIS — Z8249 Family history of ischemic heart disease and other diseases of the circulatory system: Secondary | ICD-10-CM | POA: Diagnosis not present

## 2022-02-13 DIAGNOSIS — I509 Heart failure, unspecified: Secondary | ICD-10-CM | POA: Diagnosis not present

## 2022-02-13 DIAGNOSIS — Z87891 Personal history of nicotine dependence: Secondary | ICD-10-CM | POA: Diagnosis not present

## 2022-02-13 DIAGNOSIS — K219 Gastro-esophageal reflux disease without esophagitis: Secondary | ICD-10-CM | POA: Diagnosis not present

## 2022-02-15 DIAGNOSIS — E782 Mixed hyperlipidemia: Secondary | ICD-10-CM | POA: Diagnosis not present

## 2022-02-15 DIAGNOSIS — R0602 Shortness of breath: Secondary | ICD-10-CM | POA: Diagnosis not present

## 2022-02-15 DIAGNOSIS — I4891 Unspecified atrial fibrillation: Secondary | ICD-10-CM | POA: Diagnosis not present

## 2022-02-15 DIAGNOSIS — I10 Essential (primary) hypertension: Secondary | ICD-10-CM | POA: Diagnosis not present

## 2022-02-15 DIAGNOSIS — I351 Nonrheumatic aortic (valve) insufficiency: Secondary | ICD-10-CM | POA: Diagnosis not present

## 2022-02-15 DIAGNOSIS — I340 Nonrheumatic mitral (valve) insufficiency: Secondary | ICD-10-CM | POA: Diagnosis not present

## 2022-03-01 DIAGNOSIS — E782 Mixed hyperlipidemia: Secondary | ICD-10-CM | POA: Diagnosis not present

## 2022-03-01 DIAGNOSIS — I10 Essential (primary) hypertension: Secondary | ICD-10-CM | POA: Diagnosis not present

## 2022-03-01 DIAGNOSIS — I351 Nonrheumatic aortic (valve) insufficiency: Secondary | ICD-10-CM | POA: Diagnosis not present

## 2022-03-01 DIAGNOSIS — E669 Obesity, unspecified: Secondary | ICD-10-CM | POA: Diagnosis not present

## 2022-03-01 DIAGNOSIS — I4891 Unspecified atrial fibrillation: Secondary | ICD-10-CM | POA: Diagnosis not present

## 2022-03-01 DIAGNOSIS — I340 Nonrheumatic mitral (valve) insufficiency: Secondary | ICD-10-CM | POA: Diagnosis not present

## 2022-03-07 ENCOUNTER — Other Ambulatory Visit: Payer: Self-pay | Admitting: Internal Medicine

## 2022-03-07 DIAGNOSIS — Z1231 Encounter for screening mammogram for malignant neoplasm of breast: Secondary | ICD-10-CM

## 2022-03-18 DIAGNOSIS — N1831 Chronic kidney disease, stage 3a: Secondary | ICD-10-CM | POA: Diagnosis not present

## 2022-03-18 DIAGNOSIS — R110 Nausea: Secondary | ICD-10-CM | POA: Diagnosis not present

## 2022-03-18 DIAGNOSIS — G588 Other specified mononeuropathies: Secondary | ICD-10-CM | POA: Diagnosis not present

## 2022-03-18 DIAGNOSIS — I10 Essential (primary) hypertension: Secondary | ICD-10-CM | POA: Diagnosis not present

## 2022-04-23 DIAGNOSIS — E669 Obesity, unspecified: Secondary | ICD-10-CM | POA: Diagnosis not present

## 2022-04-23 DIAGNOSIS — I340 Nonrheumatic mitral (valve) insufficiency: Secondary | ICD-10-CM | POA: Diagnosis not present

## 2022-04-23 DIAGNOSIS — I4891 Unspecified atrial fibrillation: Secondary | ICD-10-CM | POA: Diagnosis not present

## 2022-04-23 DIAGNOSIS — I10 Essential (primary) hypertension: Secondary | ICD-10-CM | POA: Diagnosis not present

## 2022-04-23 DIAGNOSIS — I351 Nonrheumatic aortic (valve) insufficiency: Secondary | ICD-10-CM | POA: Diagnosis not present

## 2022-04-23 DIAGNOSIS — E782 Mixed hyperlipidemia: Secondary | ICD-10-CM | POA: Diagnosis not present

## 2022-04-24 DIAGNOSIS — F321 Major depressive disorder, single episode, moderate: Secondary | ICD-10-CM | POA: Diagnosis not present

## 2022-04-24 DIAGNOSIS — E782 Mixed hyperlipidemia: Secondary | ICD-10-CM | POA: Diagnosis not present

## 2022-04-24 DIAGNOSIS — R300 Dysuria: Secondary | ICD-10-CM | POA: Diagnosis not present

## 2022-04-24 DIAGNOSIS — I10 Essential (primary) hypertension: Secondary | ICD-10-CM | POA: Diagnosis not present

## 2022-04-24 DIAGNOSIS — M797 Fibromyalgia: Secondary | ICD-10-CM | POA: Diagnosis not present

## 2022-04-24 DIAGNOSIS — N1831 Chronic kidney disease, stage 3a: Secondary | ICD-10-CM | POA: Diagnosis not present

## 2022-04-24 DIAGNOSIS — G588 Other specified mononeuropathies: Secondary | ICD-10-CM | POA: Diagnosis not present

## 2022-05-02 ENCOUNTER — Ambulatory Visit
Admission: RE | Admit: 2022-05-02 | Discharge: 2022-05-02 | Disposition: A | Discharge status: Home / Self Care | Payer: 59 | Source: Ambulatory Visit | Attending: Internal Medicine | Admitting: Internal Medicine

## 2022-05-02 DIAGNOSIS — Z1231 Encounter for screening mammogram for malignant neoplasm of breast: Secondary | ICD-10-CM | POA: Diagnosis not present

## 2022-07-04 DIAGNOSIS — E782 Mixed hyperlipidemia: Secondary | ICD-10-CM | POA: Diagnosis not present

## 2022-07-04 DIAGNOSIS — G588 Other specified mononeuropathies: Secondary | ICD-10-CM | POA: Diagnosis not present

## 2022-07-04 DIAGNOSIS — R5382 Chronic fatigue, unspecified: Secondary | ICD-10-CM | POA: Diagnosis not present

## 2022-07-04 DIAGNOSIS — I10 Essential (primary) hypertension: Secondary | ICD-10-CM | POA: Diagnosis not present

## 2022-07-04 DIAGNOSIS — M797 Fibromyalgia: Secondary | ICD-10-CM | POA: Diagnosis not present

## 2022-07-04 DIAGNOSIS — E538 Deficiency of other specified B group vitamins: Secondary | ICD-10-CM | POA: Diagnosis not present

## 2022-07-04 DIAGNOSIS — R5381 Other malaise: Secondary | ICD-10-CM | POA: Diagnosis not present

## 2022-07-04 DIAGNOSIS — Z0000 Encounter for general adult medical examination without abnormal findings: Secondary | ICD-10-CM | POA: Diagnosis not present

## 2022-07-04 DIAGNOSIS — N1831 Chronic kidney disease, stage 3a: Secondary | ICD-10-CM | POA: Diagnosis not present

## 2022-07-10 ENCOUNTER — Telehealth: Payer: Self-pay

## 2022-07-10 NOTE — Telephone Encounter (Signed)
Patient had check up with PCP 07/04/22 and she wanted to know if Amiodarone could be causing her labs to be abnormal. Please advise.

## 2022-07-12 DIAGNOSIS — E538 Deficiency of other specified B group vitamins: Secondary | ICD-10-CM | POA: Diagnosis not present

## 2022-07-18 ENCOUNTER — Ambulatory Visit: Payer: 59 | Admitting: Cardiovascular Disease

## 2022-07-23 ENCOUNTER — Telehealth: Payer: Self-pay | Admitting: Hematology and Oncology

## 2022-07-23 NOTE — Telephone Encounter (Signed)
Patient called multiple times to cancel 4/24 appointment. No voicemail in scheduling mailbox. Cancelled patient's appointment. Patient will call back when feeling better to reschedule.

## 2022-07-24 ENCOUNTER — Inpatient Hospital Stay: Payer: 59 | Admitting: Hematology and Oncology

## 2022-07-25 ENCOUNTER — Ambulatory Visit (INDEPENDENT_AMBULATORY_CARE_PROVIDER_SITE_OTHER): Payer: 59 | Admitting: Cardiovascular Disease

## 2022-07-25 ENCOUNTER — Encounter: Payer: Self-pay | Admitting: Cardiovascular Disease

## 2022-07-25 VITALS — BP 134/84 | HR 74 | Ht 67.0 in | Wt 204.2 lb

## 2022-07-25 DIAGNOSIS — I10 Essential (primary) hypertension: Secondary | ICD-10-CM

## 2022-07-25 DIAGNOSIS — I4891 Unspecified atrial fibrillation: Secondary | ICD-10-CM | POA: Insufficient documentation

## 2022-07-25 DIAGNOSIS — I480 Paroxysmal atrial fibrillation: Secondary | ICD-10-CM | POA: Diagnosis not present

## 2022-07-25 MED ORDER — ELIQUIS 5 MG PO TABS
5.0000 mg | ORAL_TABLET | Freq: Two times a day (BID) | ORAL | 1 refills | Status: DC
Start: 2022-07-25 — End: 2023-01-20

## 2022-07-25 MED ORDER — METOPROLOL SUCCINATE ER 25 MG PO TB24: 25.0000 mg | ORAL_TABLET | Freq: Every day | ORAL | 1 refills | Status: DC

## 2022-07-25 NOTE — Progress Notes (Signed)
Cardiology Office Note   Date:  07/26/2022   ID:  Ashley Mann, Ashley Mann February 11, 1961, MRN [id number]  PCP:  Ashley Baas, MD  Cardiologist:  Ashley Blackwater, MD      History of Present Illness: Ashley Mann is a 62 y.o. female who presents for  Chief Complaint  Patient presents with   Follow-up    3 month follow up    Patient in office for routine cardiac exam. Denies chest pain, shortness of breath, edema, palpitations. Patient's recent TSH elevated, PCP states it is from amiodarone.      Past Medical History:  Diagnosis Date   Breast cancer, right (HCC) 2006   Mastectomy, chemo + rad tx's.   Depression    Genital herpes in women    History of unilateral modified radical mastectomy 11/21/04   Hyperlipidemia    Hypertension      Past Surgical History:  Procedure Laterality Date   ABDOMINAL HYSTERECTOMY     BREAST BIOPSY Left    BREAST RECONSTRUCTION     BREAST SURGERY     rt maste   CESAREAN SECTION     CHOLECYSTECTOMY     MASTECTOMY Right 2006   mastectomy    MODIFIED RADICAL MASTECTOMY W/ AXILLARY LYMPH NODE DISSECTION  11/21/2004   Right. With TRAM reconstruction.    OOPHORECTOMY     REDUCTION MAMMAPLASTY     reduction   SALPINGOOPHORECTOMY       Current Outpatient Medications  Medication Sig Dispense Refill   Ascorbic Acid 1000 MG CHEW Chew 1 each by mouth.     atorvastatin (LIPITOR) 20 MG tablet Take 1 tablet (20 mg total) by mouth daily.     cetirizine (ZYRTEC ALLERGY) 10 MG tablet Take 1 tablet (10 mg total) by mouth daily.     Cholecalciferol (VITAMIN D) 50 MCG (2000 UT) CAPS Take 1 capsule (2,000 Units total) by mouth daily. 30 capsule    Cyanocobalamin (B-12) 1000 MCG CAPS Take 1 capsule by mouth daily.     cyclobenzaprine (FLEXERIL) 10 MG tablet Take 10 mg by mouth 3 (three) times daily as needed for muscle spasms.     losartan (COZAAR) 50 MG tablet Take 50 mg by mouth daily.     Multiple Vitamin (MULTIVITAMIN) tablet Take 1 tablet by  mouth daily.     pantoprazole (PROTONIX) 40 MG tablet Take 1 tablet (40 mg total) by mouth 2 (two) times daily.     ELIQUIS 5 MG TABS tablet Take 1 tablet (5 mg total) by mouth 2 (two) times daily. 180 tablet 1   metoprolol succinate (TOPROL-XL) 25 MG 24 hr tablet Take 1 tablet (25 mg total) by mouth daily. 30 tablet 1   No current facility-administered medications for this visit.    Allergies:   Amiodarone    Social History:   reports that she has quit smoking. Her smoking use included cigarettes. She has a 25.00 pack-year smoking history. She has never used smokeless tobacco. She reports that she does not drink alcohol and does not use drugs.   Family History:  family history includes Cancer in her father, maternal grandmother, and paternal aunt.    ROS:     Review of Systems  Constitutional: Negative.   HENT: Negative.    Eyes: Negative.   Respiratory: Negative.    Cardiovascular: Negative.   Gastrointestinal: Negative.   Genitourinary: Negative.   Musculoskeletal: Negative.   Skin: Negative.   Neurological: Negative.   Endo/Heme/Allergies: Negative.  Psychiatric/Behavioral: Negative.    All other systems reviewed and are negative.   All other systems are reviewed and negative.   PHYSICAL EXAM: VS:  BP 134/84 (BP Location: Left Arm, Patient Position: Sitting, Cuff Size: Large)   Pulse 74   Ht 5\' 7"  (1.702 m)   Wt 204 lb 3.2 oz (92.6 kg)   SpO2 100%   BMI 31.98 kg/m  , BMI Body mass index is 31.98 kg/m. Last weight:  Wt Readings from Last 3 Encounters:  07/25/22 204 lb 3.2 oz (92.6 kg)  08/12/21 204 lb (92.5 kg)  07/23/21 210 lb (95.3 kg)   Physical Exam Constitutional:      Appearance: Normal appearance.  Cardiovascular:     Rate and Rhythm: Normal rate and regular rhythm.     Heart sounds: Normal heart sounds.  Pulmonary:     Effort: Pulmonary effort is normal.     Breath sounds: Normal breath sounds.  Musculoskeletal:     Right lower leg: No edema.      Left lower leg: No edema.  Neurological:     Mental Status: She is alert.     EKG: none today  Recent Labs: No results found for requested labs within last 365 days.    Lipid Panel    Component Value Date/Time   CHOL 151 03/21/2015 1503   TRIG 167.0 (H) 03/21/2015 1503   HDL 44.10 03/21/2015 1503   CHOLHDL 3 03/21/2015 1503   VLDL 33.4 03/21/2015 1503   LDLCALC 73 03/21/2015 1503      Other studies Reviewed: Patient: 5672.0 - Ashley Mann DOB:  September 13, 1960  Date:  11/13/2021 11:00 Provider: Adrian Blackwater MD Encounter: ALL ANGIOGRAMS (CTA BRAIN, CAROTIDS, RENAL ARTERIES, PE)   Page 1 REASON FOR VISIT  Referred by Dr.Arpita Fentress [place] Flakes.    TESTS  Imaging: Computed Tomographic Angiography:  Cardiac multidetector CT was performed paying particular attention to the coronary arteries for the diagnosis of: I34.89. Diagnostic Drugs:  Administered iohexol (Omnipaque) through an antecubital vein and images from the examination were analyzed for the presence and extent of coronary artery disease, using 3D image processing software. 100 mL of non-ionic contrast (Omnipaque) was used.   TEST CONCLUSIONS  Quality of study: Fair.  1-Calcium score: 0  2-Right dominant system.  3-Normal coronaries.  Ashley Blackwater MD  Electronically signed by: Ashley Mann     Date: 11/27/2021 09:07  Patient: 5672.0 - Ashley Mann DOB:  [birth year]  Date:  11/02/2021 10:30 Provider: Adrian Blackwater MD Encounter: ECHO   Page 2 REASON FOR VISIT  Visit for: Echocardiogram/R06.02  Sex:   Female   wt= 204   lbs.  BP=126/78  Height= 67   inches.   TESTS  Imaging: Echocardiogram:  An echocardiogram in (2-d) mode was performed and in Doppler mode with color flow velocity mapping was performed. The aortic valve cusps are abnormal 1.3  cm, flow velocity 1.28   m/s, and systolic calculated mean flow gradient 4  mmHg. Mitral valve diastolic peak flow velocity E .533    m/s and E/A ratio 0.7. Aortic  root diameter 2.2  cm. The LVOT internal diameter 2.6  cm and flow velocity was abnormal .985    m/s. LV systolic dimension 1.52  cm, diastolic 4  cm, posterior wall thickness 1.24   cm, fractional shortening 62  %, and EF 91 %. IVS thickness 0.998  cm. LA dimension 3.7 cm. Mitral Valve has Trace Regurgitation. Aortic Valve has Mild Regurgitation. Tricuspid Valve  has Trace Regurgitation.     ASSESSMENT  Technically adequate study.  Normal chamber sizes.  Normal left ventricular systolic function.  Mild left ventricular hypertrophy with GRADE 1 (relaxation abnormality) diastolic dysfunction.  Normal right ventricular systolic function.  Normal right ventricular diastolic function.  Normal left ventricular wall motion.  Normal right ventricular wall motion.  Trace tricuspid regurgitation.  Mild pulmonary hypertension.  Trace mitral regurgitation.  Mild aortic regurgitation.  No pericardial effusion.  Mild LVH.   THERAPY   Referring physician: Laurier Nancy  Sonographer: Ashley Mann.   Ashley Blackwater MD  Electronically signed by: Ashley Mann     Date: 11/05/2021 09:03  Patient: 5672.0 - Jeris A. Cloer DOB:  [birth year]  Date:  11/23/2019 08:00 Provider: Adrian Blackwater MD Encounter: NUCLEAR STRESS TEST   Page 1 TESTS    [facility] [address] [place], [place] [address] [phone] STUDY:  Gated Stress / Rest Myocardial Perfusion Imaging Tomographic (SPECT) Including attenuation correction Wall Motion, Left Ventricular Ejection Fraction By Gated Technique.Persantine Stress Test. SEX:  Female   WEIGHT: 210 lbs   HEIGHT: 67 in                  ARMS UP: YES/NO                                                                                                                                                                                REFERRING PHYSICIAN:  Dr.Vernell Townley [place] Flakes  INDICATION FOR STUDY: CP                                                                                                                                                                                                                    TECHNIQUE:  Approximately 20 minutes following the intravenous administration of 9.9 mCi of Tc-65m Sestamibi after stress testing in a reclined supine position with arms above their head if able to do so, gated SPECT imaging of the heart was performed. After about a 2hr break, the patient was injected intravenously with 35.2 mCi of Tc-61m Sestamibi.  Approximately 45 minutes later in the same position as stress imaging SPECT rest imaging of the heart was performed.  STRESS BY:  Ashley Blackwater, MD PROTOCOL:  Persantine  DOSE ADMIN: 10 cc         ROUTE OF ADMINISTRATION: IV                                                                            MAX PRED HR:  160                    85%: 136               75%: 120                                                                                                                   RESTING BP: 128/78   RESTING HR:  72   PEAK BP:  114/70    PEAK HR: 92  EXERCISE DURATION:    4 min injection                                            REASON FOR TEST TERMINATION:    Protocol end                                                                                                                              SYMPTOMS:   None                                                                                                                                                                                                          EKG RESULTS:  NSR. 72/min. Occasional PVC's. No significant ST changes with persantine.                                                              IMAGE QUALITY: Good  PERFUSION/WALL MOTION FINDINGS: EF = 55%. Moderate size and intensity reversible mid inferolateral, small mild reversible basal anterior,basal anterolateral, mid anterior, and mid anterolateral wall defects, normal wall motion.                                                                           IMPRESSION: Possible ischemia in the LCX territory with normal LVEF, advise CCTA.                                                                                                                                                                                                                                                                                      Ashley Blackwater, MD Stress Interpreting Physician / Nuclear Interpreting Physician  Ashley Blackwater MD  Electronically signed by: Ashley Mann     Date: 12/16/2019 14:45   ASSESSMENT AND PLAN:    ICD-10-CM   1. Essential (primary) hypertension  I10     2. Paroxysmal atrial fibrillation (HCC)  I48.0        Problem List Items Addressed This Visit       Cardiovascular and Mediastinum   Essential (primary) hypertension - Primary    Controlled today. Continue same medications.       Relevant Medications   metoprolol succinate (TOPROL-XL) 25 MG 24 hr tablet   ELIQUIS 5 MG TABS tablet   Atrial fibrillation (HCC)    Patient in sinus rhythm on auscultation. Recent TSH 10.404 on 07/04/22. Previous TSH normal. Started amiodarone 04/2022. Patient wanting to stop amiodarone. Will taper amiodarone and then stop. Add metoprolol 25 mg daily.       Relevant Medications   metoprolol succinate (TOPROL-XL)  25 MG 24 hr tablet   ELIQUIS 5 MG TABS tablet    Disposition:   Return in about 4 weeks (around 08/22/2022).    Total time spent: 30 minutes  Signed,  Ashley Blackwater, MD  07/26/2022 9:06 AM    [facility]

## 2022-07-25 NOTE — Patient Instructions (Signed)
Decrease amiodarone to 100 mg daily for 3 days, then stop. Then start metoprolol

## 2022-07-26 NOTE — Assessment & Plan Note (Signed)
Patient in sinus rhythm on auscultation. Recent TSH 10.404 on 07/04/22. Previous TSH normal. Started amiodarone 04/2022. Patient wanting to stop amiodarone. Will taper amiodarone and then stop. Add metoprolol 25 mg daily.

## 2022-07-26 NOTE — Assessment & Plan Note (Signed)
Controlled today. Continue same medications. 

## 2022-08-01 DIAGNOSIS — I4891 Unspecified atrial fibrillation: Secondary | ICD-10-CM | POA: Diagnosis not present

## 2022-08-01 DIAGNOSIS — N1831 Chronic kidney disease, stage 3a: Secondary | ICD-10-CM | POA: Diagnosis not present

## 2022-08-01 DIAGNOSIS — C50911 Malignant neoplasm of unspecified site of right female breast: Secondary | ICD-10-CM | POA: Diagnosis not present

## 2022-08-01 DIAGNOSIS — E785 Hyperlipidemia, unspecified: Secondary | ICD-10-CM | POA: Diagnosis not present

## 2022-08-12 DIAGNOSIS — Z0000 Encounter for general adult medical examination without abnormal findings: Secondary | ICD-10-CM | POA: Diagnosis not present

## 2022-08-12 DIAGNOSIS — E538 Deficiency of other specified B group vitamins: Secondary | ICD-10-CM | POA: Diagnosis not present

## 2022-08-22 ENCOUNTER — Ambulatory Visit (INDEPENDENT_AMBULATORY_CARE_PROVIDER_SITE_OTHER): Payer: 59 | Admitting: Cardiovascular Disease

## 2022-08-22 ENCOUNTER — Encounter: Payer: Self-pay | Admitting: Cardiovascular Disease

## 2022-08-22 VITALS — Ht 67.0 in | Wt 207.0 lb

## 2022-08-22 DIAGNOSIS — R42 Dizziness and giddiness: Secondary | ICD-10-CM | POA: Diagnosis not present

## 2022-08-22 DIAGNOSIS — E7849 Other hyperlipidemia: Secondary | ICD-10-CM | POA: Diagnosis not present

## 2022-08-22 DIAGNOSIS — I10 Essential (primary) hypertension: Secondary | ICD-10-CM | POA: Diagnosis not present

## 2022-08-22 DIAGNOSIS — I480 Paroxysmal atrial fibrillation: Secondary | ICD-10-CM | POA: Diagnosis not present

## 2022-08-22 MED ORDER — METOPROLOL SUCCINATE ER 25 MG PO TB24: ORAL_TABLET | ORAL | 11 refills | Status: DC

## 2022-08-22 MED ORDER — METOPROLOL SUCCINATE ER 25 MG PO TB24: ORAL_TABLET | ORAL | 11 refills | Status: AC

## 2022-08-22 NOTE — Progress Notes (Signed)
Cardiology Office Note   Date:  08/22/2022   ID:  Ashley Mann December 04, 1960, MRN [id number]  PCP:  Enid Baas, MD  Cardiologist:  Adrian Blackwater, MD      History of Present Illness: Ashley Mann is a 62 y.o. female who presents for  Chief Complaint  Patient presents with   Follow-up    4 week follow up    Chest Pain  This is a chronic problem. The current episode started more than 1 year ago. The onset quality is gradual. The problem has been unchanged. The pain is at a severity of 4/10. Associated symptoms include palpitations and shortness of breath.  Shortness of Breath This is a chronic problem. The current episode started more than 1 year ago. The problem has been resolved. Associated symptoms include chest pain.  Palpitations  Associated symptoms include chest pain and shortness of breath.      Past Medical History:  Diagnosis Date   Breast cancer, right (HCC) 2006   Mastectomy, chemo + rad tx's.   Depression    Genital herpes in women    History of unilateral modified radical mastectomy 11/21/04   Hyperlipidemia    Hypertension      Past Surgical History:  Procedure Laterality Date   ABDOMINAL HYSTERECTOMY     BREAST BIOPSY Left    BREAST RECONSTRUCTION     BREAST SURGERY     rt maste   CESAREAN SECTION     CHOLECYSTECTOMY     MASTECTOMY Right 2006   mastectomy    MODIFIED RADICAL MASTECTOMY W/ AXILLARY LYMPH NODE DISSECTION  11/21/2004   Right. With TRAM reconstruction.    OOPHORECTOMY     REDUCTION MAMMAPLASTY     reduction   SALPINGOOPHORECTOMY       Current Outpatient Medications  Medication Sig Dispense Refill   Ascorbic Acid 1000 MG CHEW Chew 1 each by mouth.     atorvastatin (LIPITOR) 20 MG tablet Take 1 tablet (20 mg total) by mouth daily.     cetirizine (ZYRTEC ALLERGY) 10 MG tablet Take 1 tablet (10 mg total) by mouth daily.     Cholecalciferol (VITAMIN D) 50 MCG (2000 UT) CAPS Take 1 capsule (2,000 Units total) by  mouth daily. 30 capsule    Cyanocobalamin (B-12) 1000 MCG CAPS Take 1 capsule by mouth daily.     cyclobenzaprine (FLEXERIL) 10 MG tablet Take 10 mg by mouth 3 (three) times daily as needed for muscle spasms.     ELIQUIS 5 MG TABS tablet Take 1 tablet (5 mg total) by mouth 2 (two) times daily. 180 tablet 1   levothyroxine (SYNTHROID) 50 MCG tablet Take 50 mcg by mouth daily before breakfast.     losartan (COZAAR) 50 MG tablet Take 50 mg by mouth daily.     Magnesium 250 MG TABS Take 1 tablet by mouth daily.     meclizine (ANTIVERT) 12.5 MG tablet Take 12.5 mg by mouth 3 (three) times daily as needed.     Multiple Vitamin (MULTIVITAMIN) tablet Take 1 tablet by mouth daily.     pantoprazole (PROTONIX) 40 MG tablet Take 1 tablet (40 mg total) by mouth 2 (two) times daily.     metoprolol succinate (TOPROL XL) 25 MG 24 hr tablet Take 1/2 po daily, and if dizziness, change to PRN 30 tablet 11   No current facility-administered medications for this visit.    Allergies:   Amiodarone    Social History:  reports that she has quit smoking. Her smoking use included cigarettes. She has a 25.00 pack-year smoking history. She has never used smokeless tobacco. She reports that she does not drink alcohol and does not use drugs.   Family History:  family history includes Cancer in her father, maternal grandmother, and paternal aunt.    ROS:     Review of Systems  Constitutional: Negative.   HENT: Negative.    Eyes: Negative.   Respiratory:  Positive for shortness of breath.   Cardiovascular:  Positive for chest pain and palpitations.  Gastrointestinal: Negative.   Genitourinary: Negative.   Musculoskeletal: Negative.   Skin: Negative.   Neurological: Negative.   Endo/Heme/Allergies: Negative.   Psychiatric/Behavioral: Negative.    All other systems reviewed and are negative.     All other systems are reviewed and negative.    PHYSICAL EXAM: VS:  Ht 5\' 7"  (1.702 m)   Wt 207 lb (93.9 kg)    BMI 32.42 kg/m  , BMI Body mass index is 32.42 kg/m. Last weight:  Wt Readings from Last 3 Encounters:  08/22/22 207 lb (93.9 kg)  07/25/22 204 lb 3.2 oz (92.6 kg)  08/12/21 204 lb (92.5 kg)     Physical Exam Constitutional:      Appearance: Normal appearance.  Cardiovascular:     Rate and Rhythm: Normal rate and regular rhythm.     Heart sounds: Normal heart sounds.  Pulmonary:     Effort: Pulmonary effort is normal.     Breath sounds: Normal breath sounds.  Musculoskeletal:     Right lower leg: No edema.     Left lower leg: No edema.  Neurological:     Mental Status: She is alert.     EKG: none today  Recent Labs: No results found for requested labs within last 365 days.    Lipid Panel    Component Value Date/Time   CHOL 151 03/21/2015 1503   TRIG 167.0 (H) 03/21/2015 1503   HDL 44.10 03/21/2015 1503   CHOLHDL 3 03/21/2015 1503   VLDL 33.4 03/21/2015 1503   LDLCALC 73 03/21/2015 1503      ASSESSMENT AND PLAN:    ICD-10-CM   1. Paroxysmal atrial fibrillation (HCC)  I48.0    change metoprolol 12.5 daily and if dizzy take PRN    2. Essential (primary) hypertension  I10     3. Dizziness  R42     4. 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    Total time spent: 30 minutes  Signed,  Adrian Blackwater, MD  08/22/2022 2:18 PM    [facility]

## 2022-09-17 DIAGNOSIS — E538 Deficiency of other specified B group vitamins: Secondary | ICD-10-CM | POA: Diagnosis not present

## 2022-09-26 ENCOUNTER — Ambulatory Visit: Payer: 59 | Admitting: Cardiovascular Disease

## 2022-09-26 ENCOUNTER — Encounter: Payer: Self-pay | Admitting: Cardiovascular Disease

## 2022-09-26 VITALS — BP 140/85 | HR 74 | Ht 67.0 in | Wt 204.2 lb

## 2022-09-26 DIAGNOSIS — I10 Essential (primary) hypertension: Secondary | ICD-10-CM | POA: Diagnosis not present

## 2022-09-26 DIAGNOSIS — R42 Dizziness and giddiness: Secondary | ICD-10-CM

## 2022-09-26 DIAGNOSIS — I480 Paroxysmal atrial fibrillation: Secondary | ICD-10-CM

## 2022-09-26 DIAGNOSIS — E7849 Other hyperlipidemia: Secondary | ICD-10-CM

## 2022-09-26 DIAGNOSIS — R0789 Other chest pain: Secondary | ICD-10-CM | POA: Diagnosis not present

## 2022-09-26 NOTE — Progress Notes (Signed)
Cardiology Office Note   Date:  09/26/2022   ID:  Mann, Ashley 09-21-1960, MRN [id number]  PCP:  Enid Baas, MD  Cardiologist:  Adrian Blackwater, MD      History of Present Illness: Ashley Mann is a 62 y.o. female who presents for  Chief Complaint  Patient presents with   Follow-up    1 mo F/U    Pain left arm, chest and abdpmen  Chest Pain  This is a chronic problem. The current episode started more than 1 month ago. The onset quality is gradual. The problem occurs intermittently. The problem has been unchanged. The pain is present in the lateral region (in the arm). The pain is at a severity of 3/10. The pain is moderate. The quality of the pain is described as stabbing.      Past Medical History:  Diagnosis Date   Breast cancer, right (HCC) 2006   Mastectomy, chemo + rad tx's.   Depression    Genital herpes in women    History of unilateral modified radical mastectomy 11/21/04   Hyperlipidemia    Hypertension      Past Surgical History:  Procedure Laterality Date   ABDOMINAL HYSTERECTOMY     BREAST BIOPSY Left    BREAST RECONSTRUCTION     BREAST SURGERY     rt maste   CESAREAN SECTION     CHOLECYSTECTOMY     MASTECTOMY Right 2006   mastectomy    MODIFIED RADICAL MASTECTOMY W/ AXILLARY LYMPH NODE DISSECTION  11/21/2004   Right. With TRAM reconstruction.    OOPHORECTOMY     REDUCTION MAMMAPLASTY     reduction   SALPINGOOPHORECTOMY       Current Outpatient Medications  Medication Sig Dispense Refill   Ascorbic Acid 1000 MG CHEW Chew 1 each by mouth.     atorvastatin (LIPITOR) 20 MG tablet Take 1 tablet (20 mg total) by mouth daily.     cetirizine (ZYRTEC ALLERGY) 10 MG tablet Take 1 tablet (10 mg total) by mouth daily.     Cholecalciferol (VITAMIN D) 50 MCG (2000 UT) CAPS Take 1 capsule (2,000 Units total) by mouth daily. 30 capsule    Cyanocobalamin (B-12) 1000 MCG CAPS Take 1 capsule by mouth daily.     cyclobenzaprine (FLEXERIL) 10  MG tablet Take 10 mg by mouth 3 (three) times daily as needed for muscle spasms.     ELIQUIS 5 MG TABS tablet Take 1 tablet (5 mg total) by mouth 2 (two) times daily. 180 tablet 1   levothyroxine (SYNTHROID) 50 MCG tablet Take 50 mcg by mouth daily before breakfast.     losartan (COZAAR) 50 MG tablet Take 50 mg by mouth daily.     Magnesium 250 MG TABS Take 1 tablet by mouth daily.     meclizine (ANTIVERT) 12.5 MG tablet Take 12.5 mg by mouth 3 (three) times daily as needed.     metoprolol succinate (TOPROL XL) 25 MG 24 hr tablet Take 1/2 po daily, and if dizziness, change to PRN 30 tablet 11   Multiple Vitamin (MULTIVITAMIN) tablet Take 1 tablet by mouth daily.     pantoprazole (PROTONIX) 40 MG tablet Take 1 tablet (40 mg total) by mouth 2 (two) times daily.     No current facility-administered medications for this visit.    Allergies:   Amiodarone    Social History:   reports that she has quit smoking. Her smoking use included cigarettes. She has a  25.00 pack-year smoking history. She has never used smokeless tobacco. She reports that she does not drink alcohol and does not use drugs.   Family History:  family history includes Cancer in her father, maternal grandmother, and paternal aunt.    ROS:     Review of Systems  Constitutional: Negative.   HENT: Negative.    Eyes: Negative.   Respiratory: Negative.    Cardiovascular:  Positive for chest pain.  Gastrointestinal: Negative.   Genitourinary: Negative.   Musculoskeletal: Negative.   Skin: Negative.   Neurological: Negative.   Endo/Heme/Allergies: Negative.   Psychiatric/Behavioral: Negative.    All other systems reviewed and are negative.     All other systems are reviewed and negative.    PHYSICAL EXAM: VS:  BP (!) 140/85   Pulse 74   Ht 5\' 7"  (1.702 m)   Wt 204 lb 3.2 oz (92.6 kg)   SpO2 98%   BMI 31.98 kg/m  , BMI Body mass index is 31.98 kg/m. Last weight:  Wt Readings from Last 3 Encounters:  09/26/22 204  lb 3.2 oz (92.6 kg)  08/22/22 207 lb (93.9 kg)  07/25/22 204 lb 3.2 oz (92.6 kg)     Physical Exam Constitutional:      Appearance: Normal appearance.  Cardiovascular:     Rate and Rhythm: Normal rate and regular rhythm.     Heart sounds: Normal heart sounds.  Pulmonary:     Effort: Pulmonary effort is normal.     Breath sounds: Normal breath sounds.  Musculoskeletal:     Right lower leg: No edema.     Left lower leg: No edema.  Neurological:     Mental Status: She is alert.       EKG:   Recent Labs: No results found for requested labs within last 365 days.    Lipid Panel    Component Value Date/Time   CHOL 151 03/21/2015 1503   TRIG 167.0 (H) 03/21/2015 1503   HDL 44.10 03/21/2015 1503   CHOLHDL 3 03/21/2015 1503   VLDL 33.4 03/21/2015 1503   LDLCALC 73 03/21/2015 1503      REASON FOR VISIT  Visit for: Echocardiogram/R06.02  Sex:   Female   wt= 204   lbs.  BP=126/78  Height= 67   inches.        TESTS  Imaging: Echocardiogram:  An echocardiogram in (2-d) mode was performed and in Doppler mode with color flow velocity mapping was performed. The aortic valve cusps are abnormal 1.3  cm, flow velocity 1.28   m/s, and systolic calculated mean flow gradient 4  mmHg. Mitral valve diastolic peak flow velocity E .533    m/s and E/A ratio 0.7. Aortic root diameter 2.2  cm. The LVOT internal diameter 2.6  cm and flow velocity was abnormal .985    m/s. LV systolic dimension 1.52  cm, diastolic 4  cm, posterior wall thickness 1.24   cm, fractional shortening 62  %, and EF 91 %. IVS thickness 0.998  cm. LA dimension 3.7 cm. Mitral Valve has Trace Regurgitation. Aortic Valve has Mild Regurgitation. Tricuspid Valve has Trace Regurgitation.     ASSESSMENT  Technically adequate study.  Normal chamber sizes.  Normal left ventricular systolic function.  Mild left ventricular hypertrophy with GRADE 1 (relaxation abnormality) diastolic dysfunction.  Normal right  ventricular systolic function.  Normal right ventricular diastolic function.  Normal left ventricular wall motion.  Normal right ventricular wall motion.  Trace tricuspid regurgitation.  Mild pulmonary  hypertension.  Trace mitral regurgitation.  Mild aortic regurgitation.  No pericardial effusion.  Mild LVH.     THERAPY   Referring physician: Laurier Nancy  Sonographer: Adrian Blackwater.      Adrian Blackwater MD  Electronically signed by: Adrian Blackwater     Date: 11/05/2021 09:03 REASON FOR VISIT  Referred by Dr.Remo Kirschenmann [place] Flakes.        TESTS  Imaging: Computed Tomographic Angiography:  Cardiac multidetector CT was performed paying particular attention to the coronary arteries for the diagnosis of: I34.89. Diagnostic Drugs:  Administered iohexol (Omnipaque) through an antecubital vein and images from the examination were analyzed for the presence and extent of coronary artery disease, using 3D image processing software. 100 mL of non-ionic contrast (Omnipaque) was used.     TEST CONCLUSIONS  Quality of study: Fair.  1-Calcium score: 0  2-Right dominant system.  3-Normal coronaries.      Adrian Blackwater MD  Electronically signed by: Adrian Blackwater     Date: 11/27/2021 09:07 Other studies Reviewed: Additional studies/ records that were reviewed today include:  Review of the above records demonstrates:       No data to display            ASSESSMENT AND PLAN:    ICD-10-CM   1. Paroxysmal atrial fibrillation (HCC)  I48.0    Had heart rate in 50-55 and stopped taking metoprolol. But had 2 episodes of palpitation, thus advise taking metoprolol 12.5 mg or 1/2 tab PRN.    2. Essential (primary) hypertension  I10     3. Dizziness  R42     4. 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 Chest pain, non-cardiac  R07.89    CCTA showed ca scor 0,  normal coronaries, continue protonix. Arm pain not related to CAD       Problem List Items Addressed This  Visit       Cardiovascular and Mediastinum   Essential (primary) hypertension   Atrial fibrillation (HCC) - Primary     Other   Familial multiple lipoprotein-type hyperlipidemia   Dizziness   Other Visit Diagnoses     Chest pain, non-cardiac       CCTA showed ca scor 0,  normal coronaries, continue protonix. Arm pain not related to CAD          Disposition:   Return in about 4 weeks (around 10/24/2022).    Total time spent: 30 minutes  Signed,  Adrian Blackwater, MD  09/26/2022 2:15 PM    [facility]

## 2022-10-07 DIAGNOSIS — N1831 Chronic kidney disease, stage 3a: Secondary | ICD-10-CM | POA: Diagnosis not present

## 2022-10-07 DIAGNOSIS — E538 Deficiency of other specified B group vitamins: Secondary | ICD-10-CM | POA: Diagnosis not present

## 2022-10-07 DIAGNOSIS — E039 Hypothyroidism, unspecified: Secondary | ICD-10-CM | POA: Diagnosis not present

## 2022-10-07 DIAGNOSIS — R42 Dizziness and giddiness: Secondary | ICD-10-CM | POA: Diagnosis not present

## 2022-10-07 DIAGNOSIS — R202 Paresthesia of skin: Secondary | ICD-10-CM | POA: Diagnosis not present

## 2022-10-07 DIAGNOSIS — E782 Mixed hyperlipidemia: Secondary | ICD-10-CM | POA: Diagnosis not present

## 2022-10-07 DIAGNOSIS — I10 Essential (primary) hypertension: Secondary | ICD-10-CM | POA: Diagnosis not present

## 2022-12-04 DIAGNOSIS — E039 Hypothyroidism, unspecified: Secondary | ICD-10-CM | POA: Diagnosis not present

## 2022-12-04 DIAGNOSIS — R7989 Other specified abnormal findings of blood chemistry: Secondary | ICD-10-CM | POA: Diagnosis not present

## 2022-12-27 ENCOUNTER — Ambulatory Visit: Payer: 59 | Admitting: Cardiovascular Disease

## 2023-01-18 ENCOUNTER — Other Ambulatory Visit: Payer: Self-pay | Admitting: Cardiovascular Disease

## 2023-01-21 DIAGNOSIS — R911 Solitary pulmonary nodule: Secondary | ICD-10-CM | POA: Diagnosis not present

## 2023-01-21 DIAGNOSIS — F17210 Nicotine dependence, cigarettes, uncomplicated: Secondary | ICD-10-CM | POA: Diagnosis not present

## 2023-01-21 DIAGNOSIS — N3000 Acute cystitis without hematuria: Secondary | ICD-10-CM | POA: Diagnosis not present

## 2023-01-21 DIAGNOSIS — E782 Mixed hyperlipidemia: Secondary | ICD-10-CM | POA: Diagnosis not present

## 2023-01-21 DIAGNOSIS — I10 Essential (primary) hypertension: Secondary | ICD-10-CM | POA: Diagnosis not present

## 2023-01-21 DIAGNOSIS — E039 Hypothyroidism, unspecified: Secondary | ICD-10-CM | POA: Diagnosis not present

## 2023-02-03 ENCOUNTER — Other Ambulatory Visit: Payer: Self-pay | Admitting: Internal Medicine

## 2023-02-03 DIAGNOSIS — R911 Solitary pulmonary nodule: Secondary | ICD-10-CM

## 2023-02-03 DIAGNOSIS — F17210 Nicotine dependence, cigarettes, uncomplicated: Secondary | ICD-10-CM

## 2023-02-04 ENCOUNTER — Other Ambulatory Visit: Payer: Self-pay | Admitting: Obstetrics and Gynecology

## 2023-02-04 DIAGNOSIS — Z1231 Encounter for screening mammogram for malignant neoplasm of breast: Secondary | ICD-10-CM

## 2023-02-13 ENCOUNTER — Ambulatory Visit
Admission: RE | Admit: 2023-02-13 | Discharge: 2023-02-13 | Disposition: A | Discharge status: Home / Self Care | Payer: 59 | Source: Ambulatory Visit | Attending: Internal Medicine | Admitting: Internal Medicine

## 2023-02-13 DIAGNOSIS — F17210 Nicotine dependence, cigarettes, uncomplicated: Secondary | ICD-10-CM

## 2023-02-13 DIAGNOSIS — R911 Solitary pulmonary nodule: Secondary | ICD-10-CM

## 2023-02-13 DIAGNOSIS — Z87891 Personal history of nicotine dependence: Secondary | ICD-10-CM | POA: Diagnosis not present

## 2023-05-05 ENCOUNTER — Ambulatory Visit
Admission: RE | Admit: 2023-05-05 | Discharge: 2023-05-05 | Disposition: A | Discharge status: Home / Self Care | Payer: 59 | Source: Ambulatory Visit | Attending: Obstetrics and Gynecology | Admitting: Obstetrics and Gynecology

## 2023-05-05 DIAGNOSIS — Z1231 Encounter for screening mammogram for malignant neoplasm of breast: Secondary | ICD-10-CM

## 2023-05-23 DIAGNOSIS — G8929 Other chronic pain: Secondary | ICD-10-CM | POA: Diagnosis not present

## 2023-05-23 DIAGNOSIS — I480 Paroxysmal atrial fibrillation: Secondary | ICD-10-CM | POA: Diagnosis not present

## 2023-05-23 DIAGNOSIS — E782 Mixed hyperlipidemia: Secondary | ICD-10-CM | POA: Diagnosis not present

## 2023-05-23 DIAGNOSIS — R109 Unspecified abdominal pain: Secondary | ICD-10-CM | POA: Diagnosis not present

## 2023-05-23 DIAGNOSIS — E039 Hypothyroidism, unspecified: Secondary | ICD-10-CM | POA: Diagnosis not present

## 2023-05-23 DIAGNOSIS — K219 Gastro-esophageal reflux disease without esophagitis: Secondary | ICD-10-CM | POA: Diagnosis not present

## 2023-05-23 DIAGNOSIS — I10 Essential (primary) hypertension: Secondary | ICD-10-CM | POA: Diagnosis not present

## 2023-06-02 DIAGNOSIS — Z853 Personal history of malignant neoplasm of breast: Secondary | ICD-10-CM | POA: Diagnosis not present

## 2023-06-02 DIAGNOSIS — Z01419 Encounter for gynecological examination (general) (routine) without abnormal findings: Secondary | ICD-10-CM | POA: Diagnosis not present

## 2023-06-02 DIAGNOSIS — K921 Melena: Secondary | ICD-10-CM | POA: Diagnosis not present

## 2023-06-03 ENCOUNTER — Other Ambulatory Visit: Payer: Self-pay | Admitting: Internal Medicine

## 2023-06-03 DIAGNOSIS — G8929 Other chronic pain: Secondary | ICD-10-CM

## 2023-06-03 DIAGNOSIS — R140 Abdominal distension (gaseous): Secondary | ICD-10-CM

## 2023-06-10 ENCOUNTER — Ambulatory Visit
Admission: RE | Admit: 2023-06-10 | Discharge: 2023-06-10 | Disposition: A | Discharge status: Home / Self Care | Source: Ambulatory Visit | Attending: Internal Medicine | Admitting: Internal Medicine

## 2023-06-10 DIAGNOSIS — R109 Unspecified abdominal pain: Secondary | ICD-10-CM | POA: Diagnosis not present

## 2023-06-10 DIAGNOSIS — N261 Atrophy of kidney (terminal): Secondary | ICD-10-CM | POA: Diagnosis not present

## 2023-06-10 DIAGNOSIS — R1084 Generalized abdominal pain: Secondary | ICD-10-CM | POA: Diagnosis not present

## 2023-06-10 DIAGNOSIS — K760 Fatty (change of) liver, not elsewhere classified: Secondary | ICD-10-CM | POA: Diagnosis not present

## 2023-06-10 DIAGNOSIS — G8929 Other chronic pain: Secondary | ICD-10-CM | POA: Diagnosis not present

## 2023-06-10 DIAGNOSIS — R140 Abdominal distension (gaseous): Secondary | ICD-10-CM | POA: Insufficient documentation

## 2023-07-10 DIAGNOSIS — E039 Hypothyroidism, unspecified: Secondary | ICD-10-CM | POA: Diagnosis not present

## 2023-07-23 DIAGNOSIS — C50911 Malignant neoplasm of unspecified site of right female breast: Secondary | ICD-10-CM | POA: Diagnosis not present

## 2023-07-23 DIAGNOSIS — Z9012 Acquired absence of left breast and nipple: Secondary | ICD-10-CM | POA: Diagnosis not present

## 2023-07-23 DIAGNOSIS — I890 Lymphedema, not elsewhere classified: Secondary | ICD-10-CM | POA: Diagnosis not present

## 2023-08-22 DIAGNOSIS — K760 Fatty (change of) liver, not elsewhere classified: Secondary | ICD-10-CM | POA: Diagnosis not present

## 2023-08-22 DIAGNOSIS — Z800 Family history of malignant neoplasm of digestive organs: Secondary | ICD-10-CM | POA: Diagnosis not present

## 2023-08-22 DIAGNOSIS — R195 Other fecal abnormalities: Secondary | ICD-10-CM | POA: Diagnosis not present

## 2023-08-22 DIAGNOSIS — Z7901 Long term (current) use of anticoagulants: Secondary | ICD-10-CM | POA: Diagnosis not present

## 2023-08-22 DIAGNOSIS — R159 Full incontinence of feces: Secondary | ICD-10-CM | POA: Diagnosis not present

## 2023-08-22 DIAGNOSIS — K921 Melena: Secondary | ICD-10-CM | POA: Diagnosis not present

## 2023-08-27 DIAGNOSIS — Z9012 Acquired absence of left breast and nipple: Secondary | ICD-10-CM | POA: Diagnosis not present

## 2023-08-27 DIAGNOSIS — C50911 Malignant neoplasm of unspecified site of right female breast: Secondary | ICD-10-CM | POA: Diagnosis not present

## 2023-08-27 DIAGNOSIS — I890 Lymphedema, not elsewhere classified: Secondary | ICD-10-CM | POA: Diagnosis not present

## 2023-10-10 DIAGNOSIS — I10 Essential (primary) hypertension: Secondary | ICD-10-CM | POA: Diagnosis not present

## 2023-10-10 DIAGNOSIS — K219 Gastro-esophageal reflux disease without esophagitis: Secondary | ICD-10-CM | POA: Diagnosis not present

## 2023-10-10 DIAGNOSIS — E039 Hypothyroidism, unspecified: Secondary | ICD-10-CM | POA: Diagnosis not present

## 2023-10-10 DIAGNOSIS — I480 Paroxysmal atrial fibrillation: Secondary | ICD-10-CM | POA: Diagnosis not present

## 2023-10-10 DIAGNOSIS — E782 Mixed hyperlipidemia: Secondary | ICD-10-CM | POA: Diagnosis not present

## 2023-10-10 DIAGNOSIS — N1831 Chronic kidney disease, stage 3a: Secondary | ICD-10-CM | POA: Diagnosis not present

## 2023-10-10 DIAGNOSIS — M545 Low back pain, unspecified: Secondary | ICD-10-CM | POA: Diagnosis not present

## 2023-10-28 DIAGNOSIS — K2940 Chronic atrophic gastritis without bleeding: Secondary | ICD-10-CM | POA: Diagnosis not present

## 2023-10-28 DIAGNOSIS — K317 Polyp of stomach and duodenum: Secondary | ICD-10-CM | POA: Diagnosis not present

## 2023-10-28 DIAGNOSIS — D123 Benign neoplasm of transverse colon: Secondary | ICD-10-CM | POA: Diagnosis not present

## 2023-10-28 DIAGNOSIS — D128 Benign neoplasm of rectum: Secondary | ICD-10-CM | POA: Diagnosis not present

## 2023-10-28 DIAGNOSIS — K31A19 Gastric intestinal metaplasia without dysplasia, unspecified site: Secondary | ICD-10-CM | POA: Diagnosis not present

## 2023-10-28 DIAGNOSIS — D124 Benign neoplasm of descending colon: Secondary | ICD-10-CM | POA: Diagnosis not present

## 2023-10-28 DIAGNOSIS — R197 Diarrhea, unspecified: Secondary | ICD-10-CM | POA: Diagnosis not present

## 2023-10-28 DIAGNOSIS — Z800 Family history of malignant neoplasm of digestive organs: Secondary | ICD-10-CM | POA: Diagnosis not present

## 2023-10-28 DIAGNOSIS — D125 Benign neoplasm of sigmoid colon: Secondary | ICD-10-CM | POA: Diagnosis not present

## 2023-10-28 DIAGNOSIS — K3189 Other diseases of stomach and duodenum: Secondary | ICD-10-CM | POA: Diagnosis not present
